# Patient Record
Sex: Female | Born: 1995 | Race: Black or African American | Hispanic: No | Marital: Single | Smoking: Never smoker
Health system: Southern US, Community
[De-identification: ages and names within clinical notes are randomized; demographics above are authoritative.]

## PROBLEM LIST (undated history)

## (undated) ENCOUNTER — Inpatient Hospital Stay (HOSPITAL_COMMUNITY): Payer: Self-pay

## (undated) DIAGNOSIS — N390 Urinary tract infection, site not specified: Secondary | ICD-10-CM

## (undated) DIAGNOSIS — Z789 Other specified health status: Secondary | ICD-10-CM

## (undated) HISTORY — DX: Other specified health status: Z78.9

## (undated) HISTORY — PX: NO PAST SURGERIES: SHX2092

---
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
## 2018-04-02 ENCOUNTER — Encounter (HOSPITAL_COMMUNITY): Payer: Self-pay | Admitting: Emergency Medicine

## 2018-04-02 ENCOUNTER — Emergency Department (HOSPITAL_COMMUNITY)
Admission: EM | Admit: 2018-04-02 | Discharge: 2018-04-02 | Disposition: A | Discharge status: Home / Self Care | Payer: Self-pay | Attending: Emergency Medicine | Admitting: Emergency Medicine

## 2018-04-02 ENCOUNTER — Other Ambulatory Visit: Payer: Self-pay

## 2018-04-02 DIAGNOSIS — N3000 Acute cystitis without hematuria: Secondary | ICD-10-CM | POA: Insufficient documentation

## 2018-04-02 LAB — URINALYSIS, ROUTINE W REFLEX MICROSCOPIC
Bilirubin Urine: NEGATIVE
GLUCOSE, UA: NEGATIVE mg/dL
Hgb urine dipstick: NEGATIVE
Ketones, ur: NEGATIVE mg/dL
Nitrite: NEGATIVE
PROTEIN: NEGATIVE mg/dL
Specific Gravity, Urine: 1.01 (ref 1.005–1.030)
pH: 7 (ref 5.0–8.0)

## 2018-04-02 LAB — PREGNANCY, URINE: PREG TEST UR: NEGATIVE

## 2018-04-02 MED ORDER — PHENAZOPYRIDINE HCL 200 MG PO TABS
200.0000 mg | ORAL_TABLET | Freq: Three times a day (TID) | ORAL | Status: DC
  Administered 2018-04-02: 200 mg via ORAL
  Filled 2018-04-02: qty 1

## 2018-04-02 MED ORDER — NITROFURANTOIN MONOHYD MACRO 100 MG PO CAPS
100.0000 mg | ORAL_CAPSULE | Freq: Once | ORAL | Status: AC
  Administered 2018-04-02: 100 mg via ORAL
  Filled 2018-04-02: qty 1

## 2018-04-02 MED ORDER — NITROFURANTOIN MONOHYD MACRO 100 MG PO CAPS
100.0000 mg | ORAL_CAPSULE | Freq: Two times a day (BID) | ORAL | 0 refills | Status: DC
Start: 2018-04-02 — End: 2018-08-18

## 2018-04-02 MED ORDER — PHENAZOPYRIDINE HCL 200 MG PO TABS: 200.0000 mg | ORAL_TABLET | Freq: Three times a day (TID) | ORAL | 0 refills | Status: DC | PRN

## 2018-04-02 NOTE — ED Triage Notes (Signed)
Patient complaining of uti and runny green stool. Patient states the stool yesterday and uti started Wednesday night. Patient is also complaining of left flank pain.

## 2018-04-02 NOTE — ED Notes (Signed)
Called lab and they added on preg, urine.

## 2018-04-02 NOTE — ED Provider Notes (Signed)
[facility]-EMERGENCY DEPT Provider Note   CSN: [id number] Arrival date & time: 04/02/18  0039     History   Chief Complaint Chief Complaint  Patient presents with  . Flank Pain    HPI Katie Goodman is a 22 y.o. female.  The history is provided by the patient. No language interpreter was used.  Dysuria   This is a new problem. The current episode started 2 days ago. The problem occurs every urination. The problem has not changed since onset.The quality of the pain is described as burning. The pain is at a severity of 5/10. The pain is moderate. There has been no fever. There is no history of pyelonephritis. Associated symptoms include urgency. Pertinent negatives include no chills, no sweats, no nausea, no vomiting, no discharge, no frequency, no hematuria, no possible pregnancy and no flank pain. Associated symptoms comments: Low back pain. She has tried nothing for the symptoms. Her past medical history is significant for recurrent UTIs. Her past medical history does not include kidney stones.  Patient with dysuria for 2 days.  No f/c/r.  Also had diarrhea prior to the start of this.  No flank pain but some low back discomfort.  No vaginal bleeding or discharge.  Denies new partners or any concerns.    Past Medical History:  Diagnosis Date  . UTI (urinary tract infection)     There are no active problems to display for this patient.   History reviewed. No pertinent surgical history.   OB History   None      Home Medications    Prior to Admission medications   Medication Sig Start Date End Date Taking? Authorizing Provider  cetirizine (ZYRTEC) 10 MG tablet Take 10 mg by mouth daily.    [provider]  dexmethylphenidate (FOCALIN XR) 20 MG 24 hr capsule Take 20 mg by mouth daily.    [provider]  nitrofurantoin, macrocrystal-monohydrate, (MACROBID) 100 MG capsule Take 1 capsule (100 mg total) by mouth 2 (two) times daily. X 7  days 04/02/18   Nicanor Alcon, Alinah Sheard, MD  phenazopyridine (PYRIDIUM) 200 MG tablet Take 1 tablet (200 mg total) by mouth 3 (three) times daily as needed for pain. 04/02/18   Kelbie Moro, MD    Family History History reviewed. No pertinent family history.  Social History Social History   Tobacco Use  . Smoking status: Never Smoker  . Smokeless tobacco: Never Used  Substance Use Topics  . Alcohol use: Never    Frequency: Never  . Drug use: Never     Allergies   Patient has no known allergies.   Review of Systems Review of Systems  Constitutional: Negative for chills and fever.  HENT: Negative for congestion.   Eyes: Negative for photophobia.  Respiratory: Negative for shortness of breath.   Cardiovascular: Negative for chest pain.  Gastrointestinal: Negative for abdominal pain, nausea and vomiting.  Genitourinary: Positive for dysuria and urgency. Negative for flank pain, frequency, hematuria, menstrual problem, vaginal bleeding and vaginal discharge.  Neurological: Negative for headaches.  All other systems reviewed and are negative.    Physical Exam Updated Vital Signs BP 116/74 (BP Location: Left Arm)   Pulse 83   Temp 97.8 F (36.6 C) (Oral)   Resp 18   Ht 5\' 8"  (1.727 m)   Wt 79.4 kg (175 lb)   LMP 03/26/2018   SpO2 100%   BMI 26.61 kg/m   Physical Exam  Constitutional: She is oriented to person,  place, and time. She appears well-developed and well-nourished. No distress.  HENT:  Head: Normocephalic and atraumatic.  Mouth/Throat: No oropharyngeal exudate.  Eyes: Pupils are equal, round, and reactive to light. Conjunctivae are normal.  Neck: Normal range of motion. Neck supple.  Cardiovascular: Normal rate, regular rhythm, normal heart sounds and intact distal pulses.  Pulmonary/Chest: Effort normal and breath sounds normal. No stridor. She has no wheezes. She has no rales.  Abdominal: Soft. Bowel sounds are normal. She exhibits no mass. There is no tenderness.  There is no rebound and no guarding.  Musculoskeletal: Normal range of motion.  Neurological: She is alert and oriented to person, place, and time. She displays normal reflexes.  Skin: Skin is warm and dry. Capillary refill takes less than 2 seconds.  Psychiatric: She has a normal mood and affect.  Nursing note and vitals reviewed.    ED Treatments / Results  Labs (all labs ordered are listed, but only abnormal results are displayed) Results for orders placed or performed during the hospital encounter of 04/02/18  Urinalysis, Routine w reflex microscopic- may I&O cath if menses  Result Value Ref Range   Color, Urine YELLOW YELLOW   APPearance CLEAR CLEAR   Specific Gravity, Urine 1.010 1.005 - 1.030   pH 7.0 5.0 - 8.0   Glucose, UA NEGATIVE NEGATIVE mg/dL   Hgb urine dipstick NEGATIVE NEGATIVE   Bilirubin Urine NEGATIVE NEGATIVE   Ketones, ur NEGATIVE NEGATIVE mg/dL   Protein, ur NEGATIVE NEGATIVE mg/dL   Nitrite NEGATIVE NEGATIVE   Leukocytes, UA MODERATE (A) NEGATIVE   RBC / HPF 0-5 0 - 5 RBC/hpf   WBC, UA 11-20 0 - 5 WBC/hpf   Bacteria, UA RARE (A) NONE SEEN   Squamous Epithelial / LPF 0-5 0 - 5   Mucus PRESENT   Pregnancy, urine  Result Value Ref Range   Preg Test, Ur NEGATIVE NEGATIVE   No results found.  EKG None  Radiology No results found.  Procedures Procedures (including critical care time)  Medications Ordered in ED Medications  phenazopyridine (PYRIDIUM) tablet 200 mg (200 mg Oral Given 04/02/18 0457)  nitrofurantoin (macrocrystal-monohydrate) (MACROBID) capsule 100 mg (100 mg Oral Given 04/02/18 0456)      Final Clinical Impressions(s) / ED Diagnoses   Final diagnoses:  Acute cystitis without hematuria    Return for weakness, numbness, changes in vision or speech, fevers >100.4 unrelieved by medication, shortness of breath, intractable vomiting, or diarrhea, abdominal pain, Inability to tolerate liquids or food, cough, altered mental status or  any concerns. No signs of systemic illness or infection. The patient is nontoxic-appearing on exam and vital signs are within normal limits.   I have reviewed the triage vital signs and the nursing notes. Pertinent labs &imaging results that were available during my care of the patient were reviewed by me and considered in my medical decision making (see chart for details).  After history, exam, and medical workup I feel the patient has been appropriately medically screened and is safe for discharge home. Pertinent diagnoses were discussed with the patient. Patient was given return precautions.    ED Discharge Orders        Ordered    nitrofurantoin, macrocrystal-monohydrate, (MACROBID) 100 MG capsule  2 times daily     04/02/18 0530    phenazopyridine (PYRIDIUM) 200 MG tablet  3 times daily PRN     04/02/18 0530       Yu Cragun, MD 04/02/18 [id number]

## 2018-04-02 NOTE — ED Notes (Signed)
Urine culture sent down to lab as well.

## 2018-04-03 ENCOUNTER — Other Ambulatory Visit: Payer: Self-pay

## 2018-04-03 ENCOUNTER — Emergency Department (HOSPITAL_COMMUNITY)
Admission: EM | Admit: 2018-04-03 | Discharge: 2018-04-03 | Disposition: A | Discharge status: Home / Self Care | Payer: Self-pay | Attending: Emergency Medicine | Admitting: Emergency Medicine

## 2018-04-03 ENCOUNTER — Encounter (HOSPITAL_COMMUNITY): Payer: Self-pay | Admitting: *Deleted

## 2018-04-03 DIAGNOSIS — N390 Urinary tract infection, site not specified: Secondary | ICD-10-CM | POA: Insufficient documentation

## 2018-04-03 DIAGNOSIS — T50905A Adverse effect of unspecified drugs, medicaments and biological substances, initial encounter: Secondary | ICD-10-CM | POA: Insufficient documentation

## 2018-04-03 DIAGNOSIS — R197 Diarrhea, unspecified: Secondary | ICD-10-CM

## 2018-04-03 DIAGNOSIS — R112 Nausea with vomiting, unspecified: Secondary | ICD-10-CM

## 2018-04-03 DIAGNOSIS — T887XXA Unspecified adverse effect of drug or medicament, initial encounter: Secondary | ICD-10-CM

## 2018-04-03 DIAGNOSIS — Z79899 Other long term (current) drug therapy: Secondary | ICD-10-CM | POA: Insufficient documentation

## 2018-04-03 DIAGNOSIS — A084 Viral intestinal infection, unspecified: Secondary | ICD-10-CM | POA: Insufficient documentation

## 2018-04-03 LAB — CBC
HCT: 34.2 % — ABNORMAL LOW (ref 36.0–46.0)
Hemoglobin: 11.4 g/dL — ABNORMAL LOW (ref 12.0–15.0)
MCH: 30.4 pg (ref 26.0–34.0)
MCHC: 33.3 g/dL (ref 30.0–36.0)
MCV: 91.2 fL (ref 78.0–100.0)
Platelets: 269 10*3/uL (ref 150–400)
RBC: 3.75 MIL/uL — ABNORMAL LOW (ref 3.87–5.11)
RDW: 13.1 % (ref 11.5–15.5)
WBC: 6.2 10*3/uL (ref 4.0–10.5)

## 2018-04-03 LAB — COMPREHENSIVE METABOLIC PANEL
ALT: 14 U/L (ref 14–54)
AST: 25 U/L (ref 15–41)
Albumin: 4.4 g/dL (ref 3.5–5.0)
Alkaline Phosphatase: 74 U/L (ref 38–126)
Anion gap: 13 (ref 5–15)
BILIRUBIN TOTAL: 0.9 mg/dL (ref 0.3–1.2)
BUN: 11 mg/dL (ref 6–20)
CALCIUM: 9.5 mg/dL (ref 8.9–10.3)
CO2: 21 mmol/L — ABNORMAL LOW (ref 22–32)
CREATININE: 0.62 mg/dL (ref 0.44–1.00)
Chloride: 104 mmol/L (ref 101–111)
GFR calc Af Amer: >60 mL/min (ref >60)
GFR calc non Af Amer: >60 mL/min (ref >60)
Glucose, Bld: 79 mg/dL (ref 65–99)
Potassium: 4.3 mmol/L (ref 3.5–5.1)
Sodium: 138 mmol/L (ref 135–145)
TOTAL PROTEIN: 8.3 g/dL — AB (ref 6.5–8.1)

## 2018-04-03 LAB — DIFFERENTIAL
BASOS ABS: 0 10*3/uL (ref 0.0–0.1)
BASOS PCT: 0 %
Eosinophils Absolute: 0.2 10*3/uL (ref 0.0–0.7)
Eosinophils Relative: 3 %
Lymphocytes Relative: 34 %
Lymphs Abs: 2.1 10*3/uL (ref 0.7–4.0)
Monocytes Absolute: 0.4 10*3/uL (ref 0.1–1.0)
Monocytes Relative: 7 %
NEUTROS PCT: 56 %
Neutro Abs: 3.4 10*3/uL (ref 1.7–7.7)

## 2018-04-03 LAB — LIPASE, BLOOD: LIPASE: 25 U/L (ref 11–51)

## 2018-04-03 MED ORDER — ONDANSETRON HCL 4 MG/2ML IJ SOLN
4.0000 mg | Freq: Once | INTRAMUSCULAR | Status: AC
  Administered 2018-04-03: 4 mg via INTRAVENOUS
  Filled 2018-04-03: qty 2

## 2018-04-03 MED ORDER — SODIUM CHLORIDE 0.9 % IV BOLUS
1000.0000 mL | Freq: Once | INTRAVENOUS | Status: AC
  Administered 2018-04-03: 1000 mL via INTRAVENOUS

## 2018-04-03 MED ORDER — ONDANSETRON 4 MG PO TBDP: 4.0000 mg | ORAL_TABLET | Freq: Three times a day (TID) | ORAL | 0 refills | Status: DC | PRN

## 2018-04-03 NOTE — ED Triage Notes (Signed)
Pt complains of vomiting since this morning. Pt states she was recently put on antibiotics for a UTI, took her first dose yesterday

## 2018-04-03 NOTE — ED Notes (Signed)
ED Provider at bedside. 

## 2018-04-03 NOTE — ED Provider Notes (Signed)
[facility]-EMERGENCY DEPT Provider Note   CSN: [id number] Arrival date & time: 04/03/18  1419     History   Chief Complaint Chief Complaint  Patient presents with  . Emesis    HPI Katie Goodman is a 22 y.o. female with a PMHx of UTIs, who presents to the ED with complaints of n/v that occurred at 4am today.  Per chart review, pt was seen in the ED in the early morning hours of 04/02/18 for c/o UTI symptoms since Wednesday (4 days ago); symptoms included dysuria, increased urinary frequency/urgency, and malodorous urine; she had a U/A that showed +leuks and 11-20 WBCs and only 0-5 squamous and +rare bacteria, Upreg neg, no other labs/imaging were done; she was diagnosed with UTI and sent home with pyridium and bactrim.  She states she started taking the bactrim yesterday, last dose was around 6pm, and she was fine.  She states that she woke up at 4 AM this morning and had 6 episodes of nonbloody nonbilious emesis with associated nausea.  She did not try anything for her symptoms, no known aggravating factors.  She has since been able to keep the rest of her medications down today, last dose of Macrobid was at 10 AM, and has not vomited but continues to feel nauseated.  She mentions that on Thursday, 3 days ago, she was having some diarrhea, states that she was having about 3 episodes daily of nonbloody watery diarrhea however today it has slowed down and she has only had one episode.  She endorses weekly NSAID use.  She denies any recent travel, sick contacts, suspicious food intake, alcohol use, or prior abdominal surgeries.  She has not been on any antibiotics prior to starting the Macrobid yesterday.  She denies fevers, chills, CP, SOB, abd pain, constipation, obstipation, melena, hematochezia, hematemesis, hematuria, vaginal bleeding/discharge, myalgias, arthralgias, numbness, tingling, focal weakness, or any other complaints at this time.  The history is provided by the  patient and medical records. No language interpreter was used.  Emesis   Associated symptoms include diarrhea. Pertinent negatives include no abdominal pain, no arthralgias, no chills, no fever and no myalgias.    Past Medical History:  Diagnosis Date  . UTI (urinary tract infection)     There are no active problems to display for this patient.   History reviewed. No pertinent surgical history.   OB History   None      Home Medications    Prior to Admission medications   Medication Sig Start Date End Date Taking? Authorizing Provider  cetirizine (ZYRTEC) 10 MG tablet Take 10 mg by mouth daily.    [provider]  dexmethylphenidate (FOCALIN XR) 20 MG 24 hr capsule Take 20 mg by mouth daily.    [provider]  nitrofurantoin, macrocrystal-monohydrate, (MACROBID) 100 MG capsule Take 1 capsule (100 mg total) by mouth 2 (two) times daily. X 7 days 04/02/18   Nicanor Alcon, April, MD  phenazopyridine (PYRIDIUM) 200 MG tablet Take 1 tablet (200 mg total) by mouth 3 (three) times daily as needed for pain. 04/02/18   Palumbo, April, MD    Family History No family history on file.  Social History Social History   Tobacco Use  . Smoking status: Never Smoker  . Smokeless tobacco: Never Used  Substance Use Topics  . Alcohol use: Never    Frequency: Never  . Drug use: Never     Allergies   Patient has no known allergies.   Review  of Systems Review of Systems  Constitutional: Negative for chills and fever.  Respiratory: Negative for shortness of breath.   Cardiovascular: Negative for chest pain.  Gastrointestinal: Positive for diarrhea, nausea and vomiting. Negative for abdominal pain, blood in stool and constipation.  Genitourinary: Positive for dysuria, frequency and urgency. Negative for hematuria, vaginal bleeding and vaginal discharge.       +malodorous urine  Musculoskeletal: Negative for arthralgias and myalgias.  Skin: Negative for color change.    Allergic/Immunologic: Negative for immunocompromised state.  Neurological: Negative for weakness and numbness.  Psychiatric/Behavioral: Negative for confusion.   All other systems reviewed and are negative for acute change except as noted in the HPI.    Physical Exam Updated Vital Signs (from Triage at 2:27pm) BP 117/79 (BP Location: Left Arm)   Pulse (!) 102   Temp 98.9 F (37.2 C) (Oral)   Resp 18   LMP 03/26/2018   SpO2 100%  Recheck VS @6pm : BP 104/72   Pulse 82   Temp 98.9 F (37.2 C) (Oral)   Resp 18   LMP 03/26/2018   SpO2 99%    Physical Exam  Constitutional: She is oriented to person, place, and time. Vital signs are normal. She appears well-developed and well-nourished.  Non-toxic appearance. No distress.  Afebrile, nontoxic, NAD  HENT:  Head: Normocephalic and atraumatic.  Mouth/Throat: Oropharynx is clear and moist and mucous membranes are normal.  Eyes: Conjunctivae and EOM are normal. Right eye exhibits no discharge. Left eye exhibits no discharge.  Neck: Normal range of motion. Neck supple.  Cardiovascular: Normal rate, regular rhythm, normal heart sounds and intact distal pulses. Exam reveals no gallop and no friction rub.  No murmur heard. No tachycardia on exam, reg rate and rhythm although there is a very infrequent extra beat (?PVC) every so often, pt completely unaware of this and asymptomatic. Nl s1/s2, no m/r/g, distal pulses intact   Pulmonary/Chest: Effort normal and breath sounds normal. No respiratory distress. She has no decreased breath sounds. She has no wheezes. She has no rhonchi. She has no rales.  Abdominal: Soft. Normal appearance and bowel sounds are normal. She exhibits no distension. There is no tenderness. There is no rigidity, no rebound, no guarding, no CVA tenderness, no tenderness at McBurney's point and negative Murphy's sign.  Soft, NTND, +BS throughout, no r/g/r, neg murphy's, neg mcburney's, no CVA TTP   Musculoskeletal: Normal  range of motion.  Neurological: She is alert and oriented to person, place, and time. She has normal strength. No sensory deficit.  Skin: Skin is warm, dry and intact. No rash noted.  Psychiatric: She has a normal mood and affect.  Nursing note and vitals reviewed.    ED Treatments / Results  Labs (all labs ordered are listed, but only abnormal results are displayed) Labs Reviewed  COMPREHENSIVE METABOLIC PANEL - Abnormal; Notable for the following components:      Result Value   CO2 21 (*)    Total Protein 8.3 (*)    All other components within normal limits  CBC - Abnormal; Notable for the following components:   RBC 3.75 (*)    Hemoglobin 11.4 (*)    HCT 34.2 (*)    All other components within normal limits  LIPASE, BLOOD  DIFFERENTIAL  CBC WITH DIFFERENTIAL/PLATELET   Results for orders placed or performed during the hospital encounter of 04/02/18  Urinalysis, Routine w reflex microscopic- may I&O cath if menses  Result Value Ref Range   Color,  Urine YELLOW YELLOW   APPearance CLEAR CLEAR   Specific Gravity, Urine 1.010 1.005 - 1.030   pH 7.0 5.0 - 8.0   Glucose, UA NEGATIVE NEGATIVE mg/dL   Hgb urine dipstick NEGATIVE NEGATIVE   Bilirubin Urine NEGATIVE NEGATIVE   Ketones, ur NEGATIVE NEGATIVE mg/dL   Protein, ur NEGATIVE NEGATIVE mg/dL   Nitrite NEGATIVE NEGATIVE   Leukocytes, UA MODERATE (A) NEGATIVE   RBC / HPF 0-5 0 - 5 RBC/hpf   WBC, UA 11-20 0 - 5 WBC/hpf   Bacteria, UA RARE (A) NONE SEEN   Squamous Epithelial / LPF 0-5 0 - 5   Mucus PRESENT   Pregnancy, urine  Result Value Ref Range   Preg Test, Ur NEGATIVE NEGATIVE    EKG None  Radiology No results found.  Procedures Procedures (including critical care time)  Medications Ordered in ED Medications  sodium chloride 0.9 % bolus 1,000 mL (1,000 mLs Intravenous New Bag/Given 04/03/18 1720)  ondansetron (ZOFRAN) injection 4 mg (4 mg Intravenous Given 04/03/18 1720)     Initial Impression /  Assessment and Plan / ED Course  I have reviewed the triage vital signs and the nursing notes.  Pertinent labs & imaging results that were available during my care of the patient were reviewed by me and considered in my medical decision making (see chart for details).     22 y.o. female here with n/v onset this morning at 4am. Was here early yesterday morning for UTI symptoms that began 4 days ago, diagnosed with UTI, started on macrobid and pyridium. Had taken it around 6pm, went to sleep and then at 4am woke up vomiting; since then hasn't continued vomiting and has tolerated her meds but still feels nauseated. Had diarrhea starting 3 days ago but it's gradually improving. Upreg yesterday neg. No labs done yesterday aside from U/A and Upreg. On exam today, afebrile and nontoxic, no abdominal or flank tenderness. No tachycardia on exam, despite HR 102 reported in triage; although it seems she has an occasional extra beat (?PVC), but pt isn't symptomatic with this. Will get basic labs to ensure no other abnormalities to explain her n/v, but it's likely that it's either from the antibiotics vs viral gastroenteritis given the diarrhea as well, vs food poisoning, etc. Will give zofran and fluids, then reassess after. Doubt need for imaging at this time.   7:47 PM CBC w/diff with mild anemia (Hgb 11.4/Hct 34.2), unclear what her baseline is, otherwise WNL. CMP essentially WNL. Lipase WNL. Pt feeling better and tolerating PO well here. Symptoms likely from abx use vs viral illness, but doubt other emergent pathology of her symptoms requiring further emergent work up at this time. Will send home with zofran rx, advised continuation of abx for UTI, discussed other OTC remedies for symptomatic relief, and BRAT diet. F/up with PCP in 1wk for recheck of symptoms. I explained the diagnosis and have given explicit precautions to return to the ER including for any other new or worsening symptoms. The patient understands  and accepts the medical plan as it's been dictated and I have answered their questions. Discharge instructions concerning home care and prescriptions have been given. The patient is STABLE and is discharged to home in good condition.    Final Clinical Impressions(s) / ED Diagnoses   Final diagnoses:  Nausea vomiting and diarrhea  Lower urinary tract infectious disease  Medication side effect  Viral gastroenteritis    ED Discharge Orders  Ordered    ondansetron (ZOFRAN ODT) 4 MG disintegrating tablet  Every 8 hours PRN     04/03/18 [address] [place], [place] 04/03/18 1947    Lorre Nick, MD 04/03/18 2254

## 2018-04-03 NOTE — Discharge Instructions (Addendum)
Your symptoms are likely due to the fact that you're on an antibiotic for a urinary tract infection, or it could be from a viral gastrointestinal illness. Continue taking the medications prescribed to you at your last visit, including the antibiotic to treat the urinary tract infection. If you don't want to take the pyridium, you don't have to (that one just helps with the pain with urination, but it's not a necessary medication). Use zofran as prescribed, as needed for nausea; consider premedicating with this medication about 30 minutes before taking your antibiotic. Alternate between tylenol and motrin as needed for pain. May consider using over the counter tums, maalox, pepto bismol, or other over the counter remedies to help with symptoms. Stay well hydrated with small sips of fluids throughout the day. Follow a BRAT (banana-rice-applesauce-toast) diet as described below for the next 24-48 hours. The 'BRAT' diet is suggested, then progress to diet as tolerated as symptoms abate. Call your regular doctor if bloody stools, persistent diarrhea, vomiting, fever or abdominal pain. Follow up with your regular doctor in 1 week for recheck of symptoms. Return to ER for changing or worsening of symptoms.

## 2018-04-03 NOTE — ED Notes (Signed)
Pt drank some fluids for PO challenge, and has kept them down so far. No nausea voiced.

## 2018-08-18 ENCOUNTER — Emergency Department (HOSPITAL_COMMUNITY): Payer: Self-pay

## 2018-08-18 ENCOUNTER — Other Ambulatory Visit: Payer: Self-pay

## 2018-08-18 ENCOUNTER — Encounter (HOSPITAL_COMMUNITY): Payer: Self-pay

## 2018-08-18 ENCOUNTER — Emergency Department (HOSPITAL_COMMUNITY)
Admission: EM | Admit: 2018-08-18 | Discharge: 2018-08-18 | Disposition: A | Discharge status: Home / Self Care | Payer: Self-pay | Attending: Emergency Medicine | Admitting: Emergency Medicine

## 2018-08-18 DIAGNOSIS — R102 Pelvic and perineal pain: Secondary | ICD-10-CM

## 2018-08-18 DIAGNOSIS — B9689 Other specified bacterial agents as the cause of diseases classified elsewhere: Secondary | ICD-10-CM

## 2018-08-18 DIAGNOSIS — N760 Acute vaginitis: Secondary | ICD-10-CM | POA: Insufficient documentation

## 2018-08-18 LAB — WET PREP, GENITAL
Sperm: NONE SEEN
TRICH WET PREP: NONE SEEN
YEAST WET PREP: NONE SEEN

## 2018-08-18 LAB — CBC WITH DIFFERENTIAL/PLATELET
ABS IMMATURE GRANULOCYTES: 0.04 10*3/uL (ref 0.00–0.07)
Basophils Absolute: 0 10*3/uL (ref 0.0–0.1)
Basophils Relative: 1 %
EOS PCT: 4 %
Eosinophils Absolute: 0.2 10*3/uL (ref 0.0–0.5)
HEMATOCRIT: 37.8 % (ref 36.0–46.0)
HEMOGLOBIN: 12 g/dL (ref 12.0–15.0)
Immature Granulocytes: 1 %
LYMPHS ABS: 1.8 10*3/uL (ref 0.7–4.0)
LYMPHS PCT: 34 %
MCH: 29.2 pg (ref 26.0–34.0)
MCHC: 31.7 g/dL (ref 30.0–36.0)
MCV: 92 fL (ref 80.0–100.0)
MONO ABS: 0.4 10*3/uL (ref 0.1–1.0)
MONOS PCT: 9 %
Neutro Abs: 2.7 10*3/uL (ref 1.7–7.7)
Neutrophils Relative %: 51 %
Platelets: 355 10*3/uL (ref 150–400)
RBC: 4.11 MIL/uL (ref 3.87–5.11)
RDW: 13 % (ref 11.5–15.5)
WBC: 5.1 10*3/uL (ref 4.0–10.5)
nRBC: 0 % (ref 0.0–0.2)

## 2018-08-18 LAB — URINALYSIS, ROUTINE W REFLEX MICROSCOPIC
Bilirubin Urine: NEGATIVE
Glucose, UA: NEGATIVE mg/dL
HGB URINE DIPSTICK: NEGATIVE
Ketones, ur: NEGATIVE mg/dL
NITRITE: NEGATIVE
Protein, ur: NEGATIVE mg/dL
SPECIFIC GRAVITY, URINE: 1.024 (ref 1.005–1.030)
pH: 5 (ref 5.0–8.0)

## 2018-08-18 LAB — COMPREHENSIVE METABOLIC PANEL
ALK PHOS: 65 U/L (ref 38–126)
ALT: 23 U/L (ref 0–44)
ANION GAP: 10 (ref 5–15)
AST: 19 U/L (ref 15–41)
Albumin: 4.1 g/dL (ref 3.5–5.0)
BILIRUBIN TOTAL: 0.6 mg/dL (ref 0.3–1.2)
BUN: 13 mg/dL (ref 6–20)
CALCIUM: 9.5 mg/dL (ref 8.9–10.3)
CO2: 25 mmol/L (ref 22–32)
CREATININE: 0.77 mg/dL (ref 0.44–1.00)
Chloride: 111 mmol/L (ref 98–111)
GFR calc non Af Amer: >60 mL/min (ref >60)
Glucose, Bld: 111 mg/dL — ABNORMAL HIGH (ref 70–99)
Potassium: 3.9 mmol/L (ref 3.5–5.1)
Sodium: 146 mmol/L — ABNORMAL HIGH (ref 135–145)
TOTAL PROTEIN: 7.5 g/dL (ref 6.5–8.1)

## 2018-08-18 LAB — I-STAT BETA HCG BLOOD, ED (MC, WL, AP ONLY)

## 2018-08-18 LAB — LIPASE, BLOOD: Lipase: 33 U/L (ref 11–51)

## 2018-08-18 MED ORDER — AZITHROMYCIN 1 G PO PACK
1.0000 g | PACK | Freq: Once | ORAL | Status: AC
  Administered 2018-08-18: 1 g via ORAL
  Filled 2018-08-18: qty 1

## 2018-08-18 MED ORDER — LIDOCAINE HCL 1 % IJ SOLN
INTRAMUSCULAR | Status: AC
  Filled 2018-08-18: qty 20

## 2018-08-18 MED ORDER — METRONIDAZOLE 500 MG PO TABS: 500.0000 mg | ORAL_TABLET | Freq: Two times a day (BID) | ORAL | 0 refills | Status: AC

## 2018-08-18 MED ORDER — CEFTRIAXONE SODIUM 250 MG IJ SOLR
250.0000 mg | Freq: Once | INTRAMUSCULAR | Status: AC
  Administered 2018-08-18: 250 mg via INTRAMUSCULAR
  Filled 2018-08-18: qty 250

## 2018-08-18 NOTE — ED Notes (Signed)
On going ultrasound in room

## 2018-08-18 NOTE — ED Provider Notes (Signed)
[facility]-EMERGENCY DEPT Provider Note   CSN: [id number] Arrival date & time: 08/18/18  0600     History   Chief Complaint Chief Complaint  Patient presents with  . Abdominal Pain    HPI Katie Goodman is a 22 y.o. female.  22yo female presents with complaint of lower abdominal and left side pain x 4 days. Pain is cramping in nature, intermittent, worse at night and with intercourse (last intercourse 2 years ago), improves with IBU and heating pad. Denies nausea, vomiting, changes in bowel or bladder habits, vaginal discharge. Reports intermittent pelvic discomfort since diagnosis and treatment for STD last year. No other complaints or concerns.      Past Medical History:  Diagnosis Date  . UTI (urinary tract infection)     There are no active problems to display for this patient.   History reviewed. No pertinent surgical history.   OB History   None      Home Medications    Prior to Admission medications   Medication Sig Start Date End Date Taking? Authorizing Provider  metroNIDAZOLE (FLAGYL) 500 MG tablet Take 1 tablet (500 mg total) by mouth 2 (two) times daily for 7 days. 08/18/18 08/25/18  Jeannie Fend, PA-C    Family History History reviewed. No pertinent family history.  Social History Social History   Tobacco Use  . Smoking status: Never Smoker  . Smokeless tobacco: Never Used  Substance Use Topics  . Alcohol use: Never    Frequency: Never  . Drug use: Never     Allergies   Patient has no known allergies.   Review of Systems Review of Systems  Constitutional: Negative for appetite change, chills and fever.  Respiratory: Negative for shortness of breath.   Cardiovascular: Negative for chest pain.  Gastrointestinal: Positive for abdominal pain. Negative for blood in stool, constipation, diarrhea, nausea and vomiting.  Genitourinary: Positive for dyspareunia and pelvic pain. Negative for dysuria, frequency,  urgency and vaginal discharge.  Musculoskeletal: Negative for arthralgias, back pain and myalgias.  Skin: Negative for rash and wound.  Allergic/Immunologic: Negative for immunocompromised state.  Neurological: Negative for weakness.  Hematological: Does not bruise/bleed easily.  Psychiatric/Behavioral: Negative for confusion.  All other systems reviewed and are negative.    Physical Exam Updated Vital Signs BP 136/89   Pulse 84   Temp 98.5 F (36.9 C) (Oral)   SpO2 100%   Physical Exam  Constitutional: She is oriented to person, place, and time. She appears well-developed and well-nourished. No distress.  HENT:  Head: Normocephalic and atraumatic.  Cardiovascular: Normal rate, regular rhythm, normal heart sounds and intact distal pulses.  No murmur heard. Pulmonary/Chest: Effort normal and breath sounds normal.  Abdominal: Soft. Normal appearance and bowel sounds are normal. She exhibits no distension. There is no tenderness. There is no CVA tenderness.  Genitourinary: Cervix exhibits no motion tenderness. No tenderness or bleeding in the vagina. Vaginal discharge found.  Genitourinary Comments: Pelvic exam completed after [place] (normal), moderate amount of yellow discharge, no cervical tenderness. RN present for exam.  Neurological: She is alert and oriented to person, place, and time.  Skin: Skin is warm and dry. Rash noted. She is not diaphoretic.  eczema  Psychiatric: She has a normal mood and affect. Her behavior is normal.  Nursing note and vitals reviewed.    ED Treatments / Results  Labs (all labs ordered are listed, but only abnormal results are displayed) Labs Reviewed  WET PREP, GENITAL -  Abnormal; Notable for the following components:      Result Value   Clue Cells Wet Prep HPF POC PRESENT (*)    WBC, Wet Prep HPF POC MANY (*)    All other components within normal limits  COMPREHENSIVE METABOLIC PANEL - Abnormal; Notable for the following components:   Sodium  146 (*)    Glucose, Bld 111 (*)    All other components within normal limits  URINALYSIS, ROUTINE W REFLEX MICROSCOPIC - Abnormal; Notable for the following components:   APPearance HAZY (*)    Leukocytes, UA LARGE (*)    Bacteria, UA RARE (*)    All other components within normal limits  CBC WITH DIFFERENTIAL/PLATELET  LIPASE, BLOOD  I-STAT BETA HCG BLOOD, ED ([facility], [facility], [facility] ONLY)  GC/CHLAMYDIA PROBE AMP () NOT AT [facility]    EKG None  Radiology US Transvaginal Non-ob  Result Date: 08/18/2018 CLINICAL DATA:  Patient with left lower quadrant pain. EXAM: TRANSABDOMINAL AND TRANSVAGINAL ULTRASOUND OF PELVIS DOPPLER ULTRASOUND OF OVARIES TECHNIQUE: Both transabdominal and transvaginal ultrasound examinations of the pelvis were performed. Transabdominal technique was performed for global imaging of the pelvis including uterus, ovaries, adnexal regions, and pelvic cul-de-sac. It was necessary to proceed with endovaginal exam following the transabdominal exam to visualize the adnexal structures. Color and duplex Doppler ultrasound was utilized to evaluate blood flow to the ovaries. COMPARISON:  None. FINDINGS: Uterus Measurements: 7.0 x 3.5 x 3.9 cm. No fibroids or other mass visualized. Endometrium Thickness: 11 mm.  No focal abnormality visualized. Right ovary Measurements: 3.2 x 1.8 x 1.6 cm. Normal appearance/no adnexal mass. Left ovary Measurements: 3.3 x 2.4 x 2.3 cm. Normal appearance/no adnexal mass. Prominent follicle left ovary. Pulsed Doppler evaluation of both ovaries demonstrates normal low-resistance arterial and venous waveforms. Other findings No abnormal free fluid. IMPRESSION: No acute process within the pelvis. No sonographic evidence to suggest torsion. Electronically Signed   By: Annia Belt M.D.   On: 08/18/2018 08:00   US Pelvis Complete  Result Date: 08/18/2018 CLINICAL DATA:  Patient with left lower quadrant pain. EXAM: TRANSABDOMINAL AND TRANSVAGINAL ULTRASOUND OF  PELVIS DOPPLER ULTRASOUND OF OVARIES TECHNIQUE: Both transabdominal and transvaginal ultrasound examinations of the pelvis were performed. Transabdominal technique was performed for global imaging of the pelvis including uterus, ovaries, adnexal regions, and pelvic cul-de-sac. It was necessary to proceed with endovaginal exam following the transabdominal exam to visualize the adnexal structures. Color and duplex Doppler ultrasound was utilized to evaluate blood flow to the ovaries. COMPARISON:  None. FINDINGS: Uterus Measurements: 7.0 x 3.5 x 3.9 cm. No fibroids or other mass visualized. Endometrium Thickness: 11 mm.  No focal abnormality visualized. Right ovary Measurements: 3.2 x 1.8 x 1.6 cm. Normal appearance/no adnexal mass. Left ovary Measurements: 3.3 x 2.4 x 2.3 cm. Normal appearance/no adnexal mass. Prominent follicle left ovary. Pulsed Doppler evaluation of both ovaries demonstrates normal low-resistance arterial and venous waveforms. Other findings No abnormal free fluid. IMPRESSION: No acute process within the pelvis. No sonographic evidence to suggest torsion. Electronically Signed   By: Annia Belt M.D.   On: 08/18/2018 08:00   [place] Art/ven Flow Abd Pelv Doppler  Result Date: 08/18/2018 CLINICAL DATA:  Patient with left lower quadrant pain. EXAM: TRANSABDOMINAL AND TRANSVAGINAL ULTRASOUND OF PELVIS DOPPLER ULTRASOUND OF OVARIES TECHNIQUE: Both transabdominal and transvaginal ultrasound examinations of the pelvis were performed. Transabdominal technique was performed for global imaging of the pelvis including uterus, ovaries, adnexal regions, and pelvic cul-de-sac. It was necessary to  proceed with endovaginal exam following the transabdominal exam to visualize the adnexal structures. Color and duplex Doppler ultrasound was utilized to evaluate blood flow to the ovaries. COMPARISON:  None. FINDINGS: Uterus Measurements: 7.0 x 3.5 x 3.9 cm. No fibroids or other mass visualized. Endometrium Thickness:  11 mm.  No focal abnormality visualized. Right ovary Measurements: 3.2 x 1.8 x 1.6 cm. Normal appearance/no adnexal mass. Left ovary Measurements: 3.3 x 2.4 x 2.3 cm. Normal appearance/no adnexal mass. Prominent follicle left ovary. Pulsed Doppler evaluation of both ovaries demonstrates normal low-resistance arterial and venous waveforms. Other findings No abnormal free fluid. IMPRESSION: No acute process within the pelvis. No sonographic evidence to suggest torsion. Electronically Signed   By: Annia Belt M.D.   On: 08/18/2018 08:00    Procedures Procedures (including critical care time)  Medications Ordered in ED Medications  azithromycin (ZITHROMAX) powder 1 g (has no administration in time range)  cefTRIAXone (ROCEPHIN) injection 250 mg (has no administration in time range)  lidocaine (XYLOCAINE) 1 % (with pres) injection (has no administration in time range)     Initial Impression / Assessment and Plan / ED Course  I have reviewed the triage vital signs and the nursing notes.  Pertinent labs & imaging results that were available during my care of the patient were reviewed by me and considered in my medical decision making (see chart for details).  Clinical Course as of Aug 19 911  Thu Aug 18, 2018  [id number] 22 year old female with complaint of pelvic pain x4 days.  On exam her abdomen is soft and nontender.  hCG is negative, CBC within normal limits, CMP without significant variance.  Urinalysis likely contaminated with large leukocytes and 6-10 epithelials.  Wet prep positive for clue cells.  On exam patient has a moderate amount of yellow discharge.  She has a history of prior STD.  Patient will be treated with Flagyl for her bacterial vaginosis, given Rocephin and Zithromax in the ER to cover STD coverage and advised to abstain from intercourse until she has her test results and partner treated if needed. [place] normal, no evidence of TOA, ectopic pregnancy, torsion.   After the discussed  management above, the patient was determined to be safe for discharge.  The patient was in agreement with this plan and all questions regarding their care were answered.  ED return precautions were discussed and the patient will return to the ED with any significant worsening of condition.      [LM]    Clinical Course User Index [LM] Jeannie Fend, PA-C   Final Clinical Impressions(s) / ED Diagnoses   Final diagnoses:  Pelvic pain in female  Bacterial vaginosis    ED Discharge Orders         Ordered    metroNIDAZOLE (FLAGYL) 500 MG tablet  2 times daily     08/18/18 0900           Jeannie Fend, PA-C 08/18/18 5284    Gerhard Munch, MD 08/18/18 [phone]

## 2018-08-18 NOTE — ED Triage Notes (Signed)
Pt reports abdominal pain over the last 4 days, She states that the location moves, but it is currently in her L lower quadrant. She also reports that she has abdominal pain with intercourse. A&Ox4. Denies vomiting or diarrhea.

## 2018-08-18 NOTE — Discharge Instructions (Addendum)
Abstain from intercourse until you know your test results. Call the hospital in 3-5 days for your results. IF your gonorrhea and chlamydia tests are negative, you may resume intercourse. IF either test is positive, your partner will need to go to the health department for free treatment. IF your test is positive, both partners need to abstain from intercourse for 10 days after treatment (this includes all forms of intercourse).   Take Flagyl as prescribed and complete the full course.  Do not drink alcohol while taking Flagyl.  Follow-up with your primary care provider or health department.  Return to ER for worsening or concerning symptoms.

## 2018-08-19 LAB — GC/CHLAMYDIA PROBE AMP (~~LOC~~) NOT AT ARMC
CHLAMYDIA, DNA PROBE: POSITIVE — AB
NEISSERIA GONORRHEA: NEGATIVE

## 2019-03-06 IMAGING — US US TRANSVAGINAL NON-OB
1 series · 13 of 25 positions shown · non-contrast
Comparison: None.

CLINICAL DATA: Patient with left lower quadrant pain.

EXAM:
TRANSABDOMINAL AND TRANSVAGINAL ULTRASOUND OF PELVIS
DOPPLER ULTRASOUND OF OVARIES
TECHNIQUE: Both transabdominal and transvaginal ultrasound examinations of the
pelvis were performed. Transabdominal technique was performed for
global imaging of the pelvis including uterus, ovaries, adnexal
regions, and pelvic cul-de-sac.
It was necessary to proceed with endovaginal exam following the
transabdominal exam to visualize the adnexal structures. Color and
duplex Doppler ultrasound was utilized to evaluate blood flow to the
ovaries.

[Series 1: us transvaginal non-ob · 0.20mm/px · 70 acquisitions, 13 frames shown]
[im 1/70]
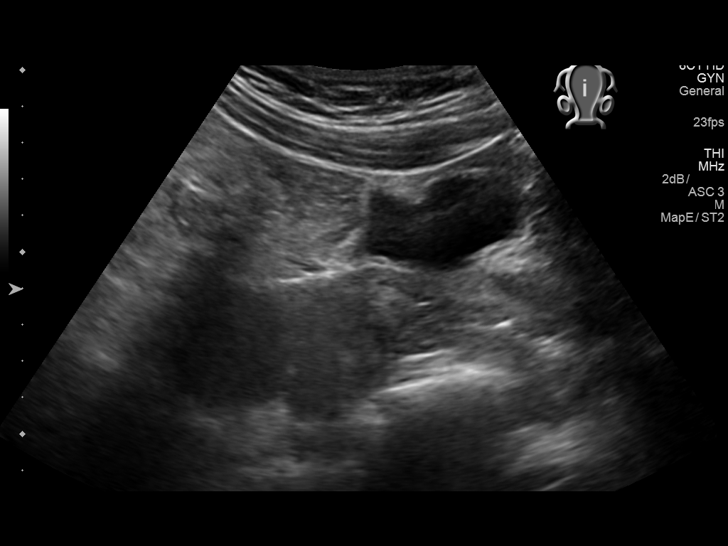
[im 6/70]
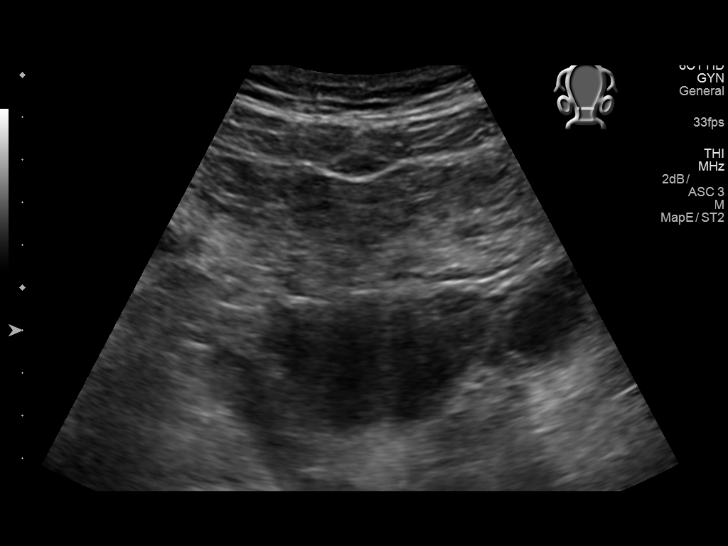
[im 12/70]
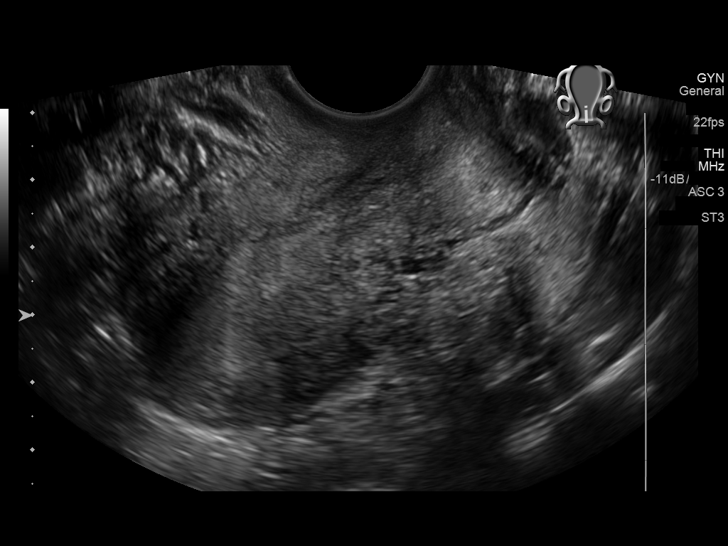
[im 18/70]
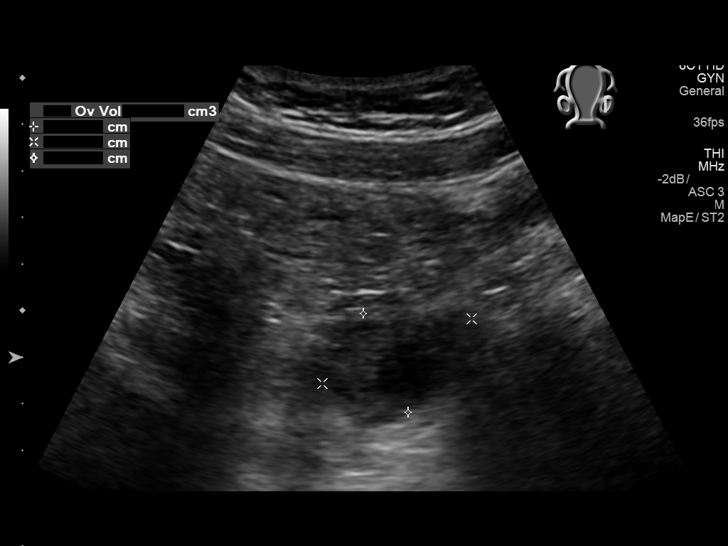
[im 24/70]
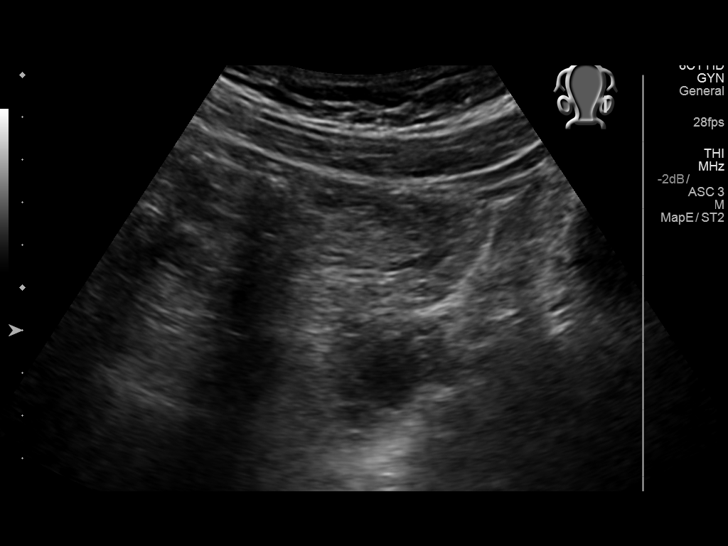
[im 29/70]
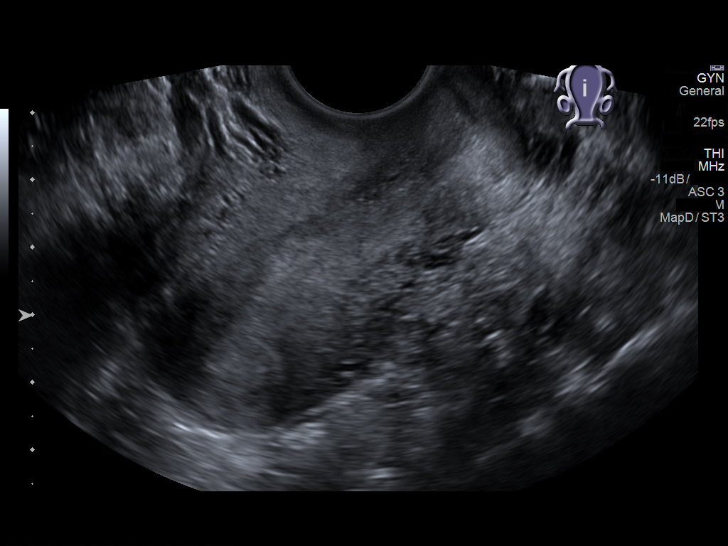
[im 35/70]
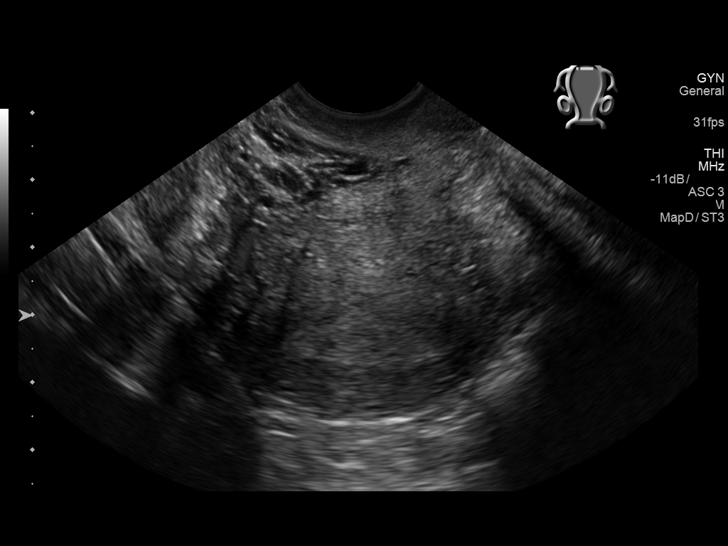
[im 41/70]
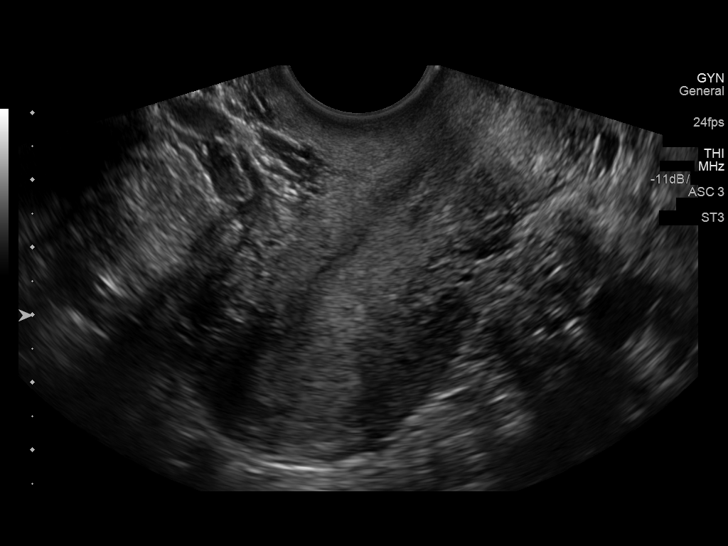
[im 47/70]
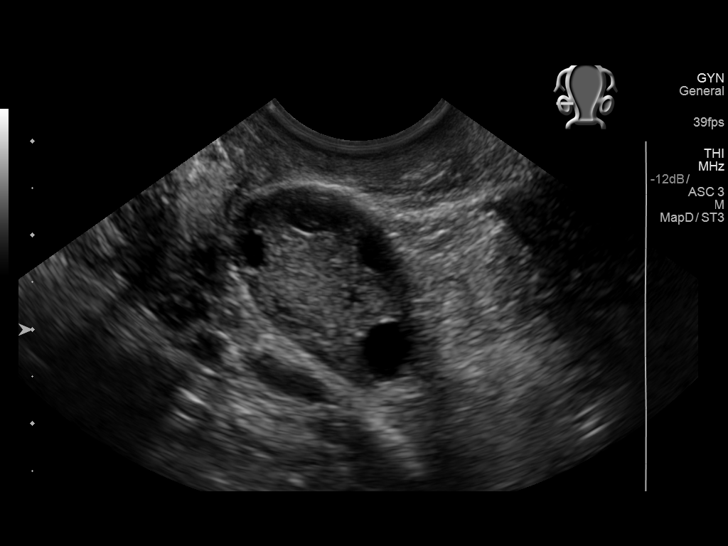
[im 52/70]
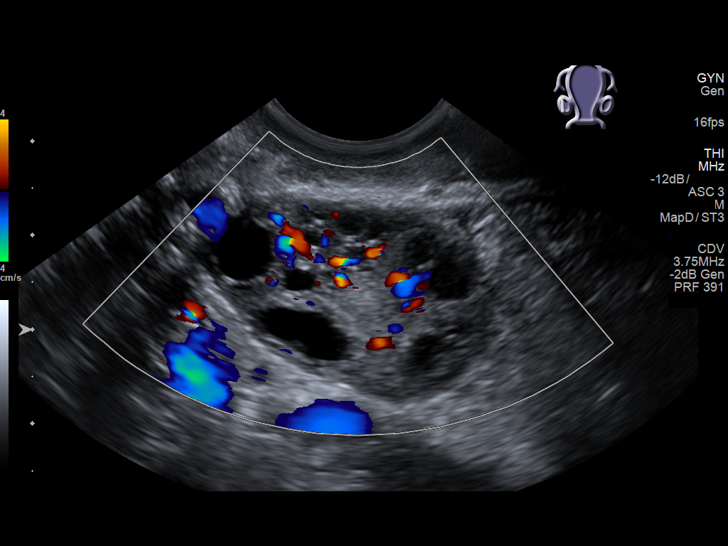
[im 58/70]
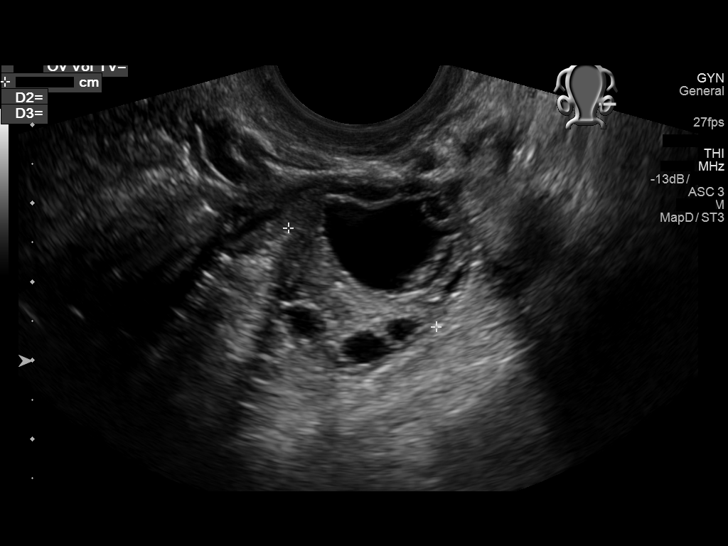
[im 64/70]
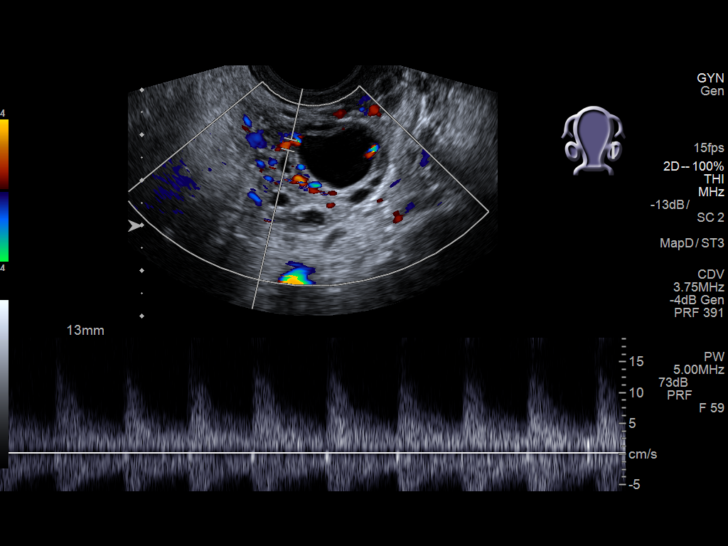
[im 70/70]
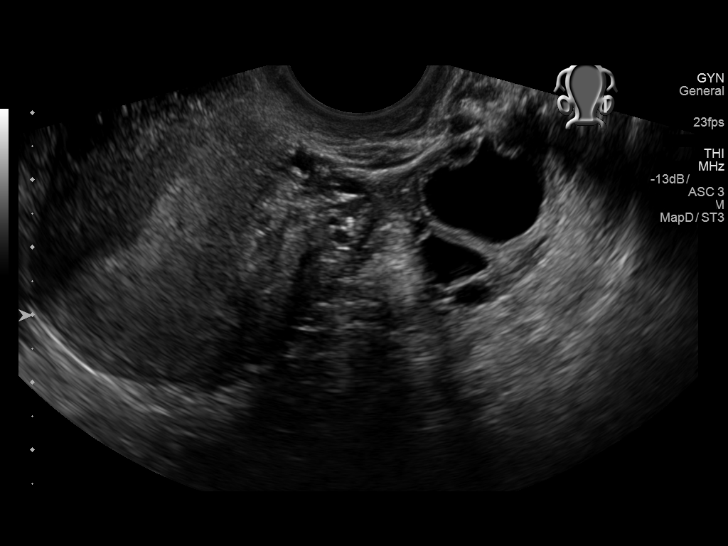

[13 of 25 positions shown; findings below may reference images not displayed]

FINDINGS: Uterus

Measurements: 7.0 x 3.5 x 3.9 cm. No fibroids or other mass
visualized.

Endometrium

Thickness: 11 mm.  No focal abnormality visualized.

Right ovary

Measurements: 3.2 x 1.8 x 1.6 cm. Normal appearance/no adnexal mass.

Left ovary

Measurements: 3.3 x 2.4 x 2.3 cm. Normal appearance/no adnexal mass.
Prominent follicle left ovary.

Pulsed Doppler evaluation of both ovaries demonstrates normal
low-resistance arterial and venous waveforms.

Other findings

No abnormal free fluid.
IMPRESSION: No acute process within the pelvis. No sonographic evidence to
suggest torsion.

## 2021-10-05 ENCOUNTER — Ambulatory Visit (HOSPITAL_COMMUNITY)
Admission: EM | Admit: 2021-10-05 | Discharge: 2021-10-05 | Disposition: A | Discharge status: Home / Self Care | Payer: BLUE CROSS/BLUE SHIELD

## 2021-10-05 ENCOUNTER — Encounter (HOSPITAL_COMMUNITY): Payer: Self-pay | Admitting: *Deleted

## 2021-10-05 ENCOUNTER — Other Ambulatory Visit: Payer: Self-pay

## 2021-10-05 DIAGNOSIS — S61308A Unspecified open wound of other finger with damage to nail, initial encounter: Secondary | ICD-10-CM | POA: Diagnosis not present

## 2021-10-05 DIAGNOSIS — S61309A Unspecified open wound of unspecified finger with damage to nail, initial encounter: Secondary | ICD-10-CM

## 2021-10-05 NOTE — ED Triage Notes (Signed)
Pt reports her natural nail was bent back along with fake nail .

## 2021-10-05 NOTE — Discharge Instructions (Addendum)
Please keep thumbnail clean and dry, can soak in Epson salt for 20 minutes 2-3 times a day, keep tip of thumb bandaged so that natural nail remains intact with nailbed.  Follow-up as needed for inflammation, increased pain, purulent drainage.

## 2021-10-05 NOTE — ED Provider Notes (Signed)
[facility]    CSN: [id number] Arrival date & time: 10/05/21  1252    HISTORY   Chief Complaint  Patient presents with   Nail Problem   HPI Katie Goodman is a 25 y.o. female. Patient states she was play fighting with her boyfriend and accidentally had her fake nail on her left thumb pulled back which brought the natural nail back with it, states the nail is now only attached to the cuticle.  Patient states that it was very painful.  The history is provided by the patient.  Past Medical History:  Diagnosis Date   UTI (urinary tract infection)    There are no problems to display for this patient.  History reviewed. No pertinent surgical history. OB History   No obstetric history on file.    Home Medications    Prior to Admission medications   Not on File   Family History History reviewed. No pertinent family history. Social History Social History   Tobacco Use   Smoking status: Never   Smokeless tobacco: Never  Vaping Use   Vaping Use: Never used  Substance Use Topics   Alcohol use: Never   Drug use: Never   Allergies   Patient has no known allergies.  Review of Systems Review of Systems Pertinent findings noted in history of present illness.   Physical Exam Triage Vital Signs ED Triage Vitals  Enc Vitals Group     BP 08/29/21 0827 (!) 147/82     Pulse Rate 08/29/21 0827 72     Resp 08/29/21 0827 18     Temp 08/29/21 0827 98.3 F (36.8 C)     Temp Source 08/29/21 0827 Oral     SpO2 08/29/21 0827 98 %     Weight --      Height --      Head Circumference --      Peak Flow --      Pain Score 08/29/21 0826 5     Pain Loc --      Pain Edu? --      Excl. in GC? --   No data found.  Updated Vital Signs BP 125/79   Pulse 81   Temp 98.3 F (36.8 C)   Resp 18   LMP 10/02/2021   SpO2 100%   Physical Exam Vitals and nursing note reviewed.  Constitutional:      General: She is not in acute distress.    Appearance: Normal  appearance. She is not ill-appearing.  HENT:     Head: Normocephalic and atraumatic.  Eyes:     General: Lids are normal.        Right eye: No discharge.        Left eye: No discharge.     Extraocular Movements: Extraocular movements intact.     Conjunctiva/sclera: Conjunctivae normal.     Right eye: Right conjunctiva is not injected.     Left eye: Left conjunctiva is not injected.  Neck:     Trachea: Trachea and phonation normal.  Cardiovascular:     Rate and Rhythm: Normal rate and regular rhythm.     Pulses: Normal pulses.     Heart sounds: Normal heart sounds. No murmur heard.   No friction rub. No gallop.  Pulmonary:     Effort: Pulmonary effort is normal. No accessory muscle usage, prolonged expiration or respiratory distress.     Breath sounds: Normal breath sounds. No stridor, decreased air movement or transmitted upper airway  sounds. No decreased breath sounds, wheezing, rhonchi or rales.  Chest:     Chest wall: No tenderness.  Musculoskeletal:        General: Normal range of motion.     Cervical back: Normal range of motion and neck supple. Normal range of motion.  Lymphadenopathy:     Cervical: No cervical adenopathy.  Skin:    General: Skin is warm and dry.     Findings: No erythema or rash.     Comments: Left lateral thumb nail is avulsed, thick decorative acrylic nail is still attached, natural nail is still attached at base of cuticle, bleeding has stopped.  Neurological:     General: No focal deficit present.     Mental Status: She is alert and oriented to person, place, and time.  Psychiatric:        Mood and Affect: Mood normal.        Behavior: Behavior normal.    Visual Acuity Right Eye Distance:   Left Eye Distance:   Bilateral Distance:    Right Eye Near:   Left Eye Near:    Bilateral Near:     UC Couse / Diagnostics / Procedures:    EKG  Radiology No results found.  Procedures Procedures (including critical care time)  UC Diagnoses /  Final Clinical Impressions(s)   I have reviewed the triage vital signs and the nursing notes.  Pertinent labs & imaging results that were available during my care of the patient were reviewed by me and considered in my medical decision making (see chart for details).    Final diagnoses:  Nail avulsion, finger, initial encounter   Patient advised no intervention needed at this time.  Patient advised to keep thumbnail intact with some, soak with Epson salt, follow-up as needed for any acute signs of infection.  ED Prescriptions   None    PDMP not reviewed this encounter.  Pending results:  Labs Reviewed - No data to display  Medications Ordered in UC: Medications - No data to display  Disposition Upon Discharge:  Condition: stable for discharge home Home: take medications as prescribed; routine discharge instructions as discussed; follow up as advised.  Patient presented with an acute illness with associated systemic symptoms and significant discomfort requiring urgent management. In my opinion, this is a condition that a prudent lay person (someone who possesses an average knowledge of health and medicine) may potentially expect to result in complications if not addressed urgently such as respiratory distress, impairment of bodily function or dysfunction of bodily organs.   Routine symptom specific, illness specific and/or disease specific instructions were discussed with the patient and/or caregiver at length.   As such, the patient has been evaluated and assessed, work-up was performed and treatment was provided in alignment with urgent care protocols and evidence based medicine.  Patient/parent/caregiver has been advised that the patient may require follow up for further testing and treatment if the symptoms continue in spite of treatment, as clinically indicated and appropriate.  If the patient was tested for COVID-19, Influenza and/or RSV, then the patient/parent/guardian was  advised to isolate at home pending the results of his/her diagnostic coronavirus test and potentially longer if they're positive. I have also advised pt that if his/her COVID-19 test returns positive, it's recommended to self-isolate for at least 10 days after symptoms first appeared AND until fever-free for 24 hours without fever reducer AND other symptoms have improved or resolved. Discussed self-isolation recommendations as well as instructions for household  member/close contacts as per the CDC and [place] DHHS, and also gave patient the COVID packet with this information.  Patient/parent/caregiver has been advised to return to the [facility] or PCP in 3-5 days if no better; to PCP or the Emergency Department if new signs and symptoms develop, or if the current signs or symptoms continue to change or worsen for further workup, evaluation and treatment as clinically indicated and appropriate  The patient will follow up with their current PCP if and as advised. If the patient does not currently have a PCP we will assist them in obtaining one.   The patient may need specialty follow up if the symptoms continue, in spite of conservative treatment and management, for further workup, evaluation, consultation and treatment as clinically indicated and appropriate.   Patient/parent/caregiver verbalized understanding and agreement of plan as discussed.  All questions were addressed during visit.  Please see discharge instructions below for further details of plan.  Discharge Instructions:   Discharge Instructions      Please keep thumbnail clean and dry, can soak in Epson salt for 20 minutes 2-3 times a day, keep tip of thumb bandaged so that natural nail remains intact with nailbed.  Follow-up as needed for inflammation, increased pain, purulent drainage.         Theadora Rama Scales, [place] 10/05/21 [phone]

## 2021-10-10 ENCOUNTER — Emergency Department (INDEPENDENT_AMBULATORY_CARE_PROVIDER_SITE_OTHER)
Admission: RE | Admit: 2021-10-10 | Discharge: 2021-10-10 | Disposition: A | Discharge status: Home / Self Care | Payer: BC Managed Care – PPO | Source: Ambulatory Visit

## 2021-10-10 ENCOUNTER — Emergency Department (INDEPENDENT_AMBULATORY_CARE_PROVIDER_SITE_OTHER): Payer: BC Managed Care – PPO

## 2021-10-10 ENCOUNTER — Other Ambulatory Visit: Payer: Self-pay

## 2021-10-10 VITALS — BP 113/77 | HR 114 | Temp 98.4°F | Resp 17

## 2021-10-10 DIAGNOSIS — S61102A Unspecified open wound of left thumb with damage to nail, initial encounter: Secondary | ICD-10-CM

## 2021-10-10 DIAGNOSIS — M79645 Pain in left finger(s): Secondary | ICD-10-CM | POA: Diagnosis not present

## 2021-10-10 DIAGNOSIS — M20012 Mallet finger of left finger(s): Secondary | ICD-10-CM | POA: Diagnosis not present

## 2021-10-10 MED ORDER — CEPHALEXIN 500 MG PO CAPS: 500.0000 mg | ORAL_CAPSULE | Freq: Four times a day (QID) | ORAL | 0 refills | Status: AC

## 2021-10-10 MED ORDER — NAPROXEN 500 MG PO TABS: 500.0000 mg | ORAL_TABLET | Freq: Two times a day (BID) | ORAL | 0 refills | Status: AC

## 2021-10-10 NOTE — ED Triage Notes (Signed)
Pt c/o LT thumb pain since Sunday. Says she was play fighting with her brother when her acrylic and real nail ripped off. States still pretty swollen and painful. Some pus drainage on Wed eve. Pain 8/10 Ibuprofen and antibiotic ointment prn.

## 2021-10-10 NOTE — Discharge Instructions (Addendum)
Take NSAID as prescribed with food Wear brace as discussed Follow up with ortho in 1-2 weeks Only start antibiotic if worsening swelling or erythema develops.

## 2021-10-10 NOTE — ED Provider Notes (Signed)
[facility]    CSN: [id number] Arrival date & time: 10/10/21  1845      History   Chief Complaint Chief Complaint  Patient presents with   Thumb pain    LT    HPI Katie Goodman is a 25 y.o. female.   Pleasant 25 year old female presents today for a follow-up on her left thumb.  She was seen initially on 10/05/21, the day of the injury happened.  She states she was roughhousing with her brother who accidentally hyperextended her thumb.  At that time she had fake acrylic fingernails on, which pulled back her real nail causing a complete avulsion.  No treatment was provided other than recommendation for Epsom salt soaks on the fourth.  She presents today with concerns about possible infection.  Her thumb is more swollen than previously and it is hard to bend.  It is painful to touch on the joint.  She has not tried any over-the-counter medications.    Past Medical History:  Diagnosis Date   UTI (urinary tract infection)     There are no problems to display for this patient.   History reviewed. No pertinent surgical history.  OB History   No obstetric history on file.      Home Medications    Prior to Admission medications   Medication Sig Start Date End Date Taking? Authorizing Provider  cephALEXin (KEFLEX) 500 MG capsule Take 1 capsule (500 mg total) by mouth 4 (four) times daily for 5 days. 10/10/21 10/15/21 Yes Welcome Fults L, PA  naproxen (NAPROSYN) 500 MG tablet Take 1 tablet (500 mg total) by mouth 2 (two) times daily for 7 days. 10/10/21 10/17/21 Yes Raney Antwine, Jodelle Gross, PA    Family History History reviewed. No pertinent family history.  Social History Social History   Tobacco Use   Smoking status: Never   Smokeless tobacco: Never  Vaping Use   Vaping Use: Never used  Substance Use Topics   Alcohol use: Never   Drug use: Never     Allergies   Patient has no known allergies.   Review of Systems Review of Systems  Musculoskeletal:   Positive for arthralgias and joint swelling.  All other systems reviewed and are negative.   Physical Exam Triage Vital Signs ED Triage Vitals  Enc Vitals Group     BP 10/10/21 1852 113/77     Pulse Rate 10/10/21 1852 (!) 114     Resp 10/10/21 1852 17     Temp 10/10/21 1852 98.4 F (36.9 C)     Temp Source 10/10/21 1852 Oral     SpO2 10/10/21 1852 99 %     Weight --      Height --      Head Circumference --      Peak Flow --      Pain Score 10/10/21 1854 8     Pain Loc --      Pain Edu? --      Excl. in GC? --    No data found.  Updated Vital Signs BP 113/77 (BP Location: Right Arm)   Pulse (!) 114   Temp 98.4 F (36.9 C) (Oral)   Resp 17   LMP 10/02/2021   SpO2 99%   Visual Acuity Right Eye Distance:   Left Eye Distance:   Bilateral Distance:    Right Eye Near:   Left Eye Near:    Bilateral Near:     Physical Exam Vitals and nursing note  reviewed.  Constitutional:      Appearance: Normal appearance. She is normal weight.  HENT:     Head: Normocephalic and atraumatic.  Musculoskeletal:        General: Swelling, tenderness, deformity and signs of injury present.     Right lower leg: No edema.     Left lower leg: No edema.     Comments: Swelling erythema and tenderness to the IP joint of the left thumb.  Thumb is stuck in flexion at the IP joint.  Skin:    General: Skin is warm.  Neurological:     General: No focal deficit present.     Mental Status: She is alert. Mental status is at baseline.     UC Treatments / Results  Labs (all labs ordered are listed, but only abnormal results are displayed) Labs Reviewed - No data to display  EKG   Radiology DG Finger Thumb Left  Result Date: 10/10/2021 CLINICAL DATA:  Hyperextension of thumb.  Pain. EXAM: LEFT THUMB 2+V COMPARISON:  None. FINDINGS: There is no evidence of fracture or dislocation. There is no evidence of arthropathy or other focal bone abnormality. Soft tissues are unremarkable.  IMPRESSION: Negative. Electronically Signed   By: Darliss Cheney M.D.   On: 10/10/2021 19:28    Procedures Splint Application  Date/Time: 10/10/2021 8:12 PM Performed by: Maretta Bees, PA Authorized by: Maretta Bees, PA   Consent:    Consent obtained:  Verbal Procedure details:    Location:  Finger   Finger location:  L thumb   Cast type:  Finger   Splint type:  Finger   Attestation: Splint applied and adjusted personally by me   Post-procedure details:    Distal neurologic exam:  Normal   Procedure completion:  Tolerated   Post-procedure imaging: reviewed   (including critical care time)  Medications Ordered in UC Medications - No data to display  Initial Impression / Assessment and Plan / UC Course  I have reviewed the triage vital signs and the nursing notes.  Pertinent labs & imaging results that were available during my care of the patient were reviewed by me and considered in my medical decision making (see chart for details).     Mallet thumb -referral placed to Ortho.  NSAIDs as discussed.  Splint placed on finger, attempted hyperextension however too painful for patient. Nail avulsion -question developing infection.  Recommended patient monitor symptoms for improvement for the next 1 to 2 days prior to starting antibiotics.  Take only if worsening erythema or swelling  Final Clinical Impressions(s) / UC Diagnoses   Final diagnoses:  Mallet thumb, left  Avulsion of nail of left thumb     Discharge Instructions      Take NSAID as prescribed with food Wear brace as discussed Follow up with ortho in 1-2 weeks Only start antibiotic if worsening swelling or erythema develops.     ED Prescriptions     Medication Sig Dispense Auth. Provider   naproxen (NAPROSYN) 500 MG tablet Take 1 tablet (500 mg total) by mouth 2 (two) times daily for 7 days. 14 tablet Erabella Kuipers L, PA   cephALEXin (KEFLEX) 500 MG capsule Take 1 capsule (500 mg total) by mouth 4  (four) times daily for 5 days. 20 capsule Laaibah Wartman L, PA      PDMP not reviewed this encounter.   Maretta Bees, [place] 10/10/21 2014

## 2022-07-29 ENCOUNTER — Emergency Department (HOSPITAL_COMMUNITY): Payer: BC Managed Care – PPO

## 2022-07-29 ENCOUNTER — Emergency Department (HOSPITAL_COMMUNITY)
Admission: EM | Admit: 2022-07-29 | Discharge: 2022-07-29 | Disposition: A | Discharge status: Home / Self Care | Payer: BC Managed Care – PPO | Attending: Emergency Medicine | Admitting: Emergency Medicine

## 2022-07-29 ENCOUNTER — Other Ambulatory Visit: Payer: Self-pay

## 2022-07-29 ENCOUNTER — Encounter (HOSPITAL_COMMUNITY): Payer: Self-pay | Admitting: Emergency Medicine

## 2022-07-29 DIAGNOSIS — N3001 Acute cystitis with hematuria: Secondary | ICD-10-CM | POA: Insufficient documentation

## 2022-07-29 DIAGNOSIS — K5900 Constipation, unspecified: Secondary | ICD-10-CM | POA: Diagnosis not present

## 2022-07-29 DIAGNOSIS — R300 Dysuria: Secondary | ICD-10-CM | POA: Diagnosis present

## 2022-07-29 LAB — URINALYSIS, ROUTINE W REFLEX MICROSCOPIC
Bilirubin Urine: NEGATIVE
Glucose, UA: NEGATIVE mg/dL
Ketones, ur: NEGATIVE mg/dL
Nitrite: NEGATIVE
Protein, ur: 100 mg/dL — AB
Specific Gravity, Urine: 1.026 (ref 1.005–1.030)
WBC, UA: >50 WBC/hpf — ABNORMAL HIGH (ref 0–5)
pH: 6 (ref 5.0–8.0)

## 2022-07-29 LAB — PREGNANCY, URINE: Preg Test, Ur: NEGATIVE

## 2022-07-29 MED ORDER — CEPHALEXIN 500 MG PO CAPS: 500.0000 mg | ORAL_CAPSULE | Freq: Two times a day (BID) | ORAL | 0 refills | Status: AC

## 2022-07-29 NOTE — ED Notes (Signed)
Patient back from  X-ray 

## 2022-07-29 NOTE — Discharge Instructions (Signed)
UTI-your urine is consistent with a UTI, have started you on a antibiotic please take as prescribed.  I would like you to follow-up with community health and wellness, [place] or [place]. Constipation-I like to start taking MiraLAX 1 capful a day, please drink plenty of water as this medication only works if you are hydrated, this can take up to 2 to 3 days to see results, may titrate until desired consistency as well as frequency.  Remember to exercise as well as eat foods high in fiber this will help prevent future constipation.  Come back to the emergency department if you develop chest pain, shortness of breath, severe abdominal pain, uncontrolled nausea, vomiting, diarrhea.

## 2022-07-29 NOTE — ED Provider Notes (Signed)
[facility] EMERGENCY DEPARTMENT Provider Note   CSN: [id number] Arrival date & time: 07/29/22  0645     History  Chief Complaint  Patient presents with   Abdominal Pain    Katie Goodman is a 26 y.o. female.  HPI   Patient without significant medical history presents with complaints of dysuria as well as lower abdominal cramping.  Patient states has been going on for about 2 weeks time, states both symptoms started at the same time, states that she has been having dysuria typically burning-like sensation after she urinates, has been more consistent, no vaginal discharge, states she is currently on her menstrual cycle, she is not on birth control, no history of ovarian cysts or  torsion's.  She also notes that she is having some lower abdominal pain, states this pain is intermittent, and is mainly when she started to have a bowel movements, states that she has been constipated for about 2 weeks, is only  passing a little bit of stool a little bit of gas, no associated nausea or vomiting, no abdominal surgeries, denies any melena or hematochezia, states that she tried 1 dose of MiraLAX without much relief.    Home Medications Prior to Admission medications   Medication Sig Start Date End Date Taking? Authorizing Provider  cephALEXin (KEFLEX) 500 MG capsule Take 1 capsule (500 mg total) by mouth 2 (two) times daily for 7 days. 07/29/22 08/05/22 Yes Carroll Sage, PA-C      Allergies    Patient has no known allergies.    Review of Systems   Review of Systems  Constitutional:  Negative for chills and fever.  Respiratory:  Negative for shortness of breath.   Cardiovascular:  Negative for chest pain.  Gastrointestinal:  Positive for abdominal pain and constipation. Negative for nausea and vomiting.  Genitourinary:  Positive for dysuria and vaginal bleeding. Negative for menstrual problem.  Neurological:  Negative for headaches.    Physical Exam Updated Vital Signs BP 116/70  (BP Location: Right Arm)   Pulse 63   Temp 98.4 F (36.9 C) (Oral)   Resp 16   LMP 07/25/2022   SpO2 100%  Physical Exam Vitals and nursing note reviewed.  Constitutional:      General: She is not in acute distress.    Appearance: She is not ill-appearing.  HENT:     Head: Normocephalic and atraumatic.     Nose: No congestion.  Eyes:     Conjunctiva/sclera: Conjunctivae normal.  Cardiovascular:     Rate and Rhythm: Normal rate and regular rhythm.  Pulmonary:     Effort: Pulmonary effort is normal.  Abdominal:     Palpations: Abdomen is soft.     Tenderness: There is no abdominal tenderness. There is no right CVA tenderness or left CVA tenderness.     Comments: Abdomen is soft, nondistended, nontender on my exam, there is no guarding rebound tenderness or peritoneal sign.  Skin:    General: Skin is warm and dry.  Neurological:     Mental Status: She is alert.  Psychiatric:        Mood and Affect: Mood normal.     ED Results / Procedures / Treatments   Labs (all labs ordered are listed, but only abnormal results are displayed) Labs Reviewed  URINALYSIS, ROUTINE W REFLEX MICROSCOPIC - Abnormal; Notable for the following components:      Result Value   APPearance CLOUDY (*)    Hgb urine dipstick SMALL (*)  Protein, ur 100 (*)    Leukocytes,Ua LARGE (*)    WBC, UA >50 (*)    Bacteria, UA RARE (*)    All other components within normal limits  URINE CULTURE  PREGNANCY, URINE    EKG None  Radiology DG Abdomen Acute W/Chest  Result Date: 07/29/2022 CLINICAL DATA:  Constipation and abdominal pain for 2 weeks. EXAM: DG ABDOMEN ACUTE WITH 1 VIEW CHEST COMPARISON:  None Available. FINDINGS: Heart size and mediastinal contours are unremarkable. There is no pleural effusion or edema. Lungs are clear. Bowel gas pattern appears nonobstructed. There is a moderate stool burden identified within the right colon and transverse colon. No dilated small bowel loops. IMPRESSION: 1.  Nonobstructive bowel gas pattern. 2. Moderate stool burden within the right colon and transverse colon. Electronically Signed   By: Signa Kell M.D.   On: 07/29/2022 10:13    Procedures Procedures    Medications Ordered in ED Medications - No data to display  ED Course/ Medical Decision Making/ A&P                           Medical Decision Making Amount and/or Complexity of Data Reviewed Labs: ordered. Radiology: ordered.   This patient presents to the ED for concern of constipation, dysuria, this involves an extensive number of treatment options, and is a complaint that carries with it a high risk of complications and morbidity.  The differential diagnosis includes bowel obstruction, UTI, ovarian torsion, PID,    Additional history obtained:  Additional history obtained from N/A External records from outside source obtained and reviewed including previous ER notes   Co morbidities that complicate the patient evaluation  N/A  Social Determinants of Health:  No primary care provider    Lab Tests:  I Ordered, and personally interpreted labs.  The pertinent results include: Urine pregnancy is negative, UA is positive for leukocytes red blood cells white blood cells rare bacteria, urine culture pending   Imaging Studies ordered:  I ordered imaging studies including acute chest abdomen I independently visualized and interpreted imaging which showed she shows mild constipation I agree with the radiologist interpretation   Cardiac Monitoring:  The patient was maintained on a cardiac monitor.  I personally viewed and interpreted the cardiac monitored which showed an underlying rhythm of: N/A   Medicines ordered and prescription drug management:  I ordered medication including N/A I have reviewed the patients home medicines and have made adjustments as needed  Critical Interventions:  N/A   Reevaluation:  Presents with dysuria as well as lower abdominal  pain, UA is consistent with UTI, her abdomen was soft, no evidence of acute abdomen, will obtain plain films for further evaluation.    Consultations Obtained:  N/A   Test Considered:  CT abdomen pelvis-deferred as my suspicion for intra-abdominal abnormality is low at this time she had an nonsurgical abdomen presentation is atypical she is nontoxic-appearing.    Rule out I have low suspicion for ovarian torsion or ovarian cyst this presentation is atypical of etiology, symptoms are mainly with bowel movements, she was nontender during my exam she has no history of this.  I have low suspicion for ectopic pregnancy as urine pregnancy is negative.  I doubt PID as she denies vaginal pain no vaginal discharge.  I have low suspicion for bowel obstruction and or volvulus that she has no nausea or vomiting, abdomen is nondistended still passing gas and having bowel movements,  x-rays negative for evidence of obstruction.  Low suspicion for kidney stone or [place] as she has no flank tenderness, she is nontoxic-appearing, there is noted blood on her UA but she is currently on her menstrual cycle which I suspect is likely cause of this.    Dispostion and problem list  After consideration of the diagnostic results and the patients response to treatment, I feel that the patent would benefit from discharge.   UTI-urine cultures are pending, will send home on antibiotics follow-up with PCP as needed strict return precautions Abdominal pain-likely secondary due to functional constipation, will have her continue with MiraLAX and improve bowel hygiene, follow-up with PCP as needed strict return precautions            Final Clinical Impression(s) / ED Diagnoses Final diagnoses:  Constipation, unspecified constipation type  Acute cystitis with hematuria    Rx / DC Orders ED Discharge Orders          Ordered    cephALEXin (KEFLEX) 500 MG capsule  2 times daily        07/29/22 1033               Carroll Sage, PA-C 07/29/22 1036    Loetta Rough, MD 07/30/22 2311

## 2022-07-29 NOTE — ED Triage Notes (Signed)
Pt c/o lower abd pain x 2 weeks. Lnbm sep 10. Had small runny bm yesterday. Denies n/v. Has used mirilax.

## 2022-07-29 NOTE — ED Notes (Signed)
Patient transported to X-ray 

## 2022-07-31 LAB — URINE CULTURE: Culture: 20000 — AB

## 2022-08-01 ENCOUNTER — Telehealth (HOSPITAL_BASED_OUTPATIENT_CLINIC_OR_DEPARTMENT_OTHER): Payer: Self-pay | Admitting: *Deleted

## 2022-08-01 NOTE — Telephone Encounter (Signed)
Post ED Visit - Positive Culture Follow-up  Culture report reviewed by antimicrobial stewardship pharmacist: [place] Team []  Elenor Quinones, Pharm.D. []  Heide Guile, Pharm.D., BCPS AQ-ID []  Parks Neptune, Pharm.D., BCPS []  Alycia Rossetti, Pharm.D., BCPS []  [place], Pharm.D., BCPS, AAHIVP []  Legrand Como, Pharm.D., BCPS, AAHIVP []  Salome Arnt, PharmD, BCPS []  Johnnette Gourd, PharmD, BCPS []  Hughes Better, PharmD, BCPS []  Leeroy Cha, PharmD []  Laqueta Linden, PharmD, BCPS [x]  Jim Desanctis, PharmD  [place] Team []  Leodis Sias, PharmD []  Lindell Spar, PharmD []  Royetta Asal, PharmD []  Graylin Shiver, Rph []  Rema Fendt) Glennon Mac, PharmD []  Arlyn Dunning, PharmD []  Netta Cedars, PharmD []  Dia Sitter, PharmD []  Leone Haven, PharmD []  Gretta Arab, PharmD []  Theodis Shove, PharmD []  Peggyann Juba, PharmD []  Reuel Boom, PharmD   Positive urine culture Treated with Cephalexin, organism sensitive to the same and no further patient follow-up is required at this time.  Katie Goodman 08/01/2022, 2:40 PM

## 2023-02-14 IMAGING — DX DG FINGER THUMB 2+V*L*
3 series · 3 of 3 positions shown · non-contrast
Comparison: None.

CLINICAL DATA: Hyperextension of thumb.  Pain.

EXAM:
LEFT THUMB 2+V

[finger ap]
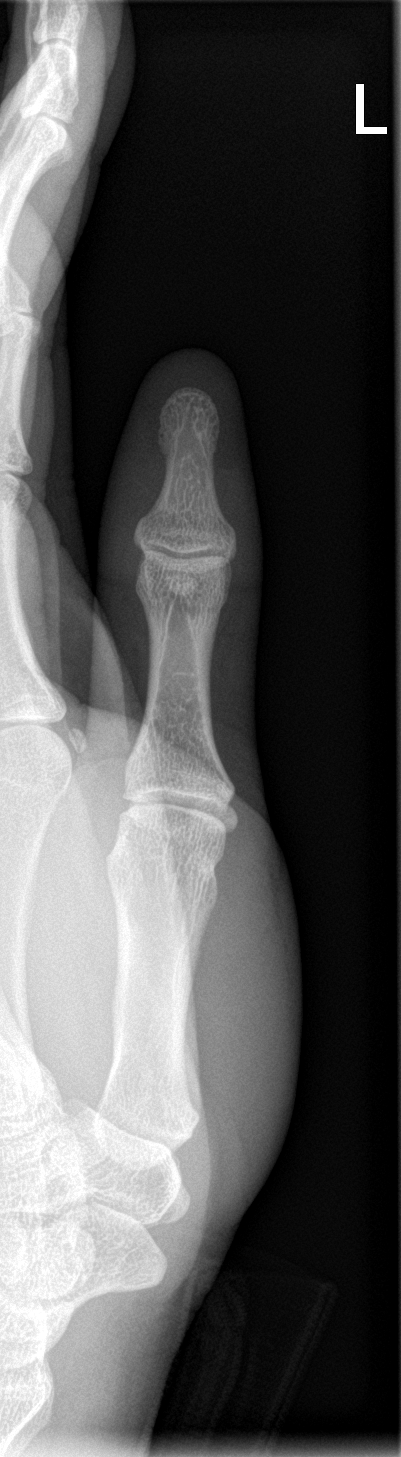

[finger obl]
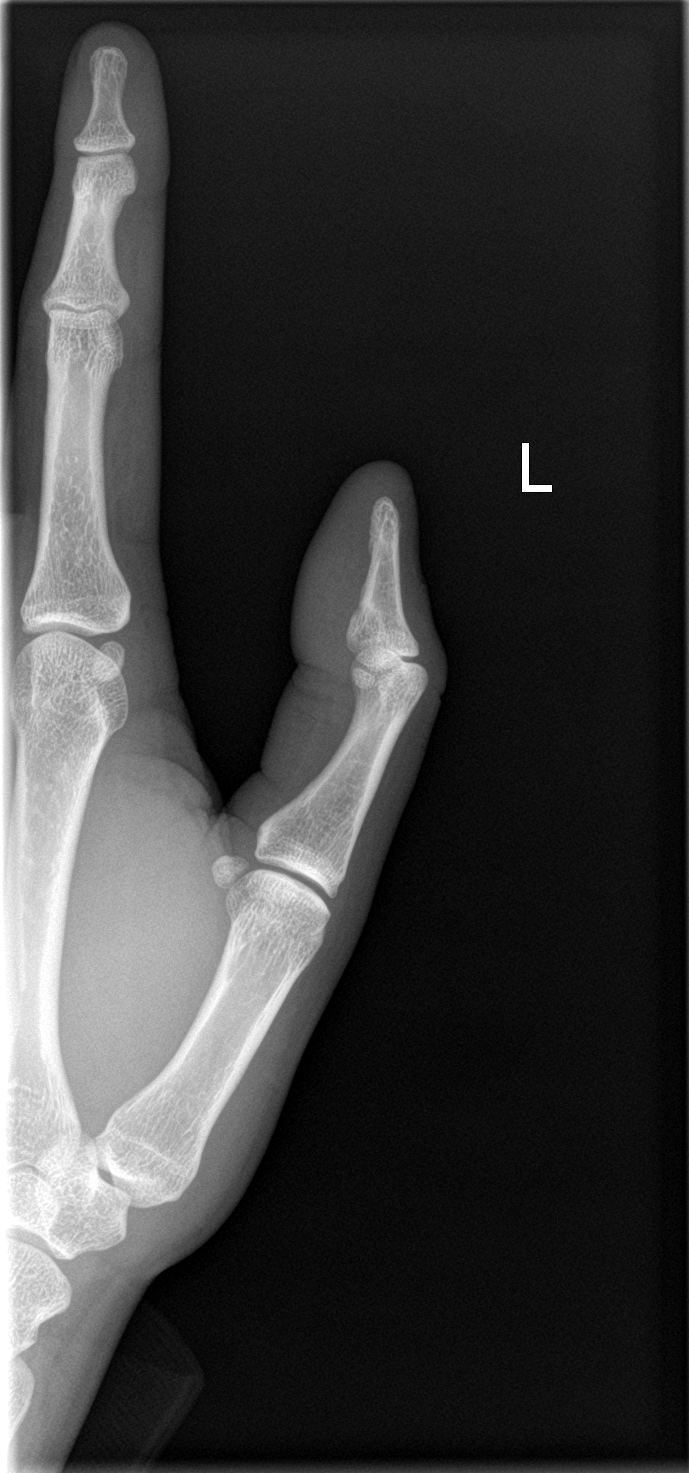

[finger lat]
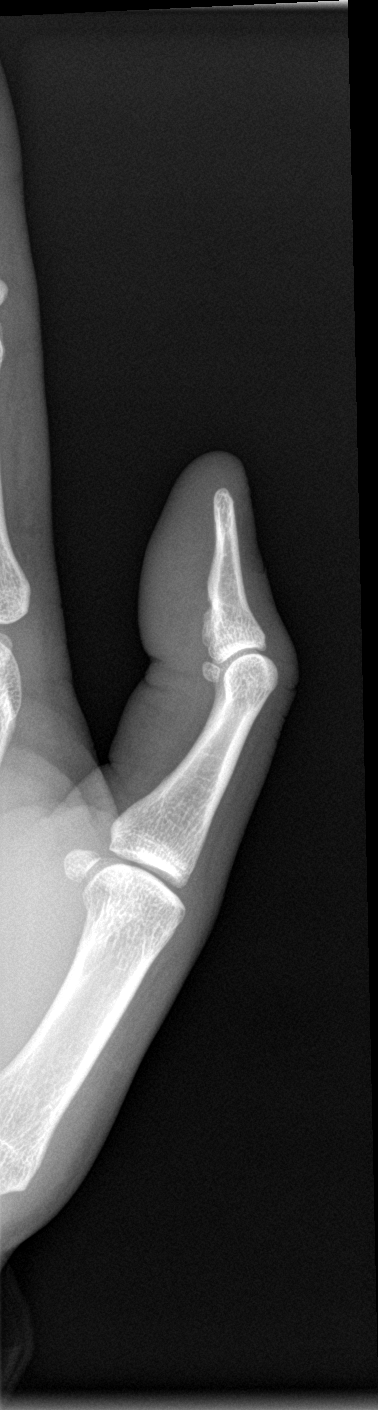

[3 of 3 positions shown; findings below may reference images not displayed]

FINDINGS: There is no evidence of fracture or dislocation. There is no
evidence of arthropathy or other focal bone abnormality. Soft
tissues are unremarkable.
IMPRESSION: Negative.

## 2024-09-19 ENCOUNTER — Encounter: Payer: Self-pay | Admitting: Physician Assistant

## 2024-09-19 ENCOUNTER — Ambulatory Visit: Admitting: Physician Assistant

## 2024-09-19 ENCOUNTER — Other Ambulatory Visit (HOSPITAL_COMMUNITY)
Admission: RE | Admit: 2024-09-19 | Discharge: 2024-09-19 | Disposition: A | Discharge status: Home / Self Care | Source: Ambulatory Visit | Attending: Physician Assistant | Admitting: Physician Assistant

## 2024-09-19 VITALS — BP 110/65 | HR 88 | Ht 67.0 in | Wt 140.8 lb

## 2024-09-19 DIAGNOSIS — Z01419 Encounter for gynecological examination (general) (routine) without abnormal findings: Secondary | ICD-10-CM

## 2024-09-19 DIAGNOSIS — Z124 Encounter for screening for malignant neoplasm of cervix: Secondary | ICD-10-CM | POA: Insufficient documentation

## 2024-09-19 DIAGNOSIS — L308 Other specified dermatitis: Secondary | ICD-10-CM | POA: Diagnosis not present

## 2024-09-19 DIAGNOSIS — Z3169 Encounter for other general counseling and advice on procreation: Secondary | ICD-10-CM

## 2024-09-19 LAB — CERVICOVAGINAL ANCILLARY ONLY
Chlamydia: NEGATIVE
Comment: NEGATIVE
Comment: NEGATIVE
Comment: NORMAL
Neisseria Gonorrhea: NEGATIVE
Trichomonas: NEGATIVE

## 2024-09-19 MED ORDER — TRIAMCINOLONE ACETONIDE 0.1 % EX OINT
TOPICAL_OINTMENT | CUTANEOUS | 0 refills | Status: AC
Start: 2024-09-19 — End: ?

## 2024-09-19 NOTE — Patient Instructions (Signed)
 What is eczema?    Eczema, also called ''atopic dermatitis,'' is a skin condition that makes the skin itchy and flaky. Doctors do not know what causes it. It often happens in people who have allergies. It can also run in families.    What are the symptoms of eczema?    The symptoms can include:    ?Intense itching    ?Color changes - In people with light skin, areas with eczema might look red or pink. In people with dark skin, they might appear dark brown, purple, or gray. Sometimes, there is a patch of skin that looks lighter than the skin around it.    ?Small bumps - These might look like dots or goosebumps (picture 1).    ?Skin that flakes off or forms scales (picture 2)    Most people with eczema have their first symptoms before they turn 5. But eczema can look different in people of different ages:    ?In babies and children younger than 59 years old, eczema tends to affect the front of the arms and legs, cheeks, or scalp (picture 3). The diaper area is not usually affected.    ?In older children and adults, eczema often affects the sides of the neck, the elbow creases, and the backs of the knees (picture 4). Adults can also get it on their wrists, hands, forearms, and face (picture 5).    ?In older children and adults, the skin can become thicker over time (picture 6), and can even form scars from too much scratching.    Is there a test for eczema?    No. But your doctor or nurse should be able to tell if you have eczema by looking at your skin and asking about your symptoms.    What can I do to reduce symptoms?    You can use unscented, thick moisturizing creams and ointments to keep your skin from getting too dry.    If possible, try to avoid or limit things that can make eczema worse. These include:    ?Being too hot or sweating too much    ?Being in very dry air    ?Stress or worry    ?Sudden temperature changes    ?Harsh soaps or cleaning products    ?Perfumes    ?Wool or synthetic fabrics (like polyester)    How is eczema treated?    There are treatments that can help relieve the symptoms, but eczema cannot be cured. Even so, about half of children with eczema grow out of it by the time they become adults.    Treatments for eczema include:    ?Moisturizing creams or ointments - These help keep the skin moist. In some cases, your doctor or nurse might suggest using a moist dressing over special creams or medicines. It helps to put on your cream or ointment right after a bath or shower.    Some people try products you put in the bathtub, such as oil or oatmeal. But these have been found to not help with eczema symptoms.    ?Steroid creams and ointments - These can help with itching and swelling. In severe cases, you might need steroids as pills. But your doctor or nurse will want you to stop taking steroid pills as soon as possible. Even though these medicines help, they can also cause problems of their own.    ?Other creams and ointments - Your doctor can prescribe these to help treat eczema on sensitive areas, such  as your face or groin, or if other creams and ointments do not work.    ?Antihistamine pills - These are medicines people often take for allergies. But they can sometimes relieve itching related to eczema.    Itching is sometimes worse at night, which can make it hard to sleep. If you have this problem, tell your doctor or nurse. They might recommend an antihistamine that can also help you sleep.    ?Phototherapy - Your skin is exposed to a special kind of light called ''ultraviolet'' light. This is usually done in a doctor's office. But this treatment is not used very much.    Doctors usually recommend phototherapy for people who do not get better with other treatments.    ?Medicines that change how the immune system works - These are only for people who do not get better with moisturizers and prescription steroid creams or ointments.    Can eczema be prevented?    Experts don't know. Babies who have a parent or sibling with eczema have a higher risk of getting it. For these babies, good skin care might be helpful, especially in cold or dry areas. Good skin care includes using moisturizing creams or ointments. But doctors don't yet know if this actually helps prevent eczema from happening later.    If you use cream or ointment on your newborn, wash your hands first. This helps lower the risk of getting germs on your baby's skin that could cause infection. Try to use products that come in a tube, instead of a jar you dip your fingers in.    When should I call the doctor?    Call for emergency help right away (in the [place]  and [place], call 9-1-1) if you have signs of a very bad allergic reaction called ''anaphylaxis.'' These include:    ?Hives, which are raised or puffy areas of skin that are itchy    ?Swelling, usually of the face, eyelids, ears, mouth, hands, or feet    ?Flushing or itching of the eyes or the skin on most of the body (without hives)    ?Runny nose or swelling of the tongue    ?Trouble breathing, noisy breathing (wheezing), coughing, or voice changes    ?Nausea, vomiting, or diarrhea    ?Feeling dizzy or passing out    ?Looking sick, seeming sleepier than usual, or not acting normally (in babies and young children)    Call for advice if:    ?You have signs of an infection - These include a fever of 100.4?F (38?C) or higher, or chills.    ?Your eczema is making you feel anxious or depressed - There are treatments that can help with this.    ?You have trouble sleeping because you are itching.    ?Your eczema:    Has pus or yellow scabs on it    Gets worse or covers most of your body    Is on your eyes or lips, or if you get a rash or blisters in your mouth    Gets worse after you were around someone with cold sores or fever blisters

## 2024-09-19 NOTE — Progress Notes (Signed)
 NGYN presents for AEX Pt states last PAP approx 2 years ago. No Hx of abnormal PAP Requesting STD testing, declines blood testing and BC

## 2024-09-19 NOTE — Progress Notes (Signed)
 ANNUAL EXAM Patient name: Katie Goodman MRN [id number]  Date of birth: Sep 02, 1996 Chief Complaint:   No chief complaint on file.  History of Present Illness:   Katie Goodman is a 28 y.o. G65P0010 female being seen today for a routine annual exam.   Current complaints: TTC for past two months without success.   Patient's last menstrual period was 09/15/2024.   The pregnancy intention screening data noted above was reviewed. Potential methods of contraception were discussed. The patient elected to proceed with No data recorded.   Last [facility] two years ago at the [facility]. Results were normal per patient.  H/O abnormal pap: no Last mammogram: never done due to age. Results were: N/A. Family h/o breast cancer: no Last colonoscopy: never done due to age. Results were: N/A. Family h/o colorectal cancer: no STI screening: Accepts Contraception: None      No data to display               No data to display           Review of Systems:   Pertinent items are noted in HPI Denies any headaches, blurred vision, fatigue, shortness of breath, chest pain, abdominal pain, abnormal vaginal discharge/itching/odor/irritation, problems with periods, bowel movements, urination, or intercourse unless otherwise stated above. Pertinent History Reviewed:  Reviewed past medical,surgical, social and family history.  Reviewed problem list, medications and allergies. Physical Assessment:   Vitals:   09/19/24 1003  BP: 110/65  Pulse: 88  Weight: 140 lb 12.8 oz (63.9 kg)  Height: 5' 7 (1.702 m)  Body mass index is 22.05 kg/m.        Physical Examination:   General appearance - well appearing, and in no distress  Mental status - alert, oriented to person, place, and time  Psych:  She has a normal mood and affect  Skin - +Patches of small papular lesions on left upper back, left lateral epicondyle, and right ankle. Appear dry with scale and excoriations.   Chest - effort normal, all lung fields  clear to auscultation bilaterally  Heart - normal rate and regular rhythm  Neck:  midline trachea, no thyromegaly or nodules  Breasts - breasts appear normal, no suspicious masses, no skin or nipple changes or  axillary nodes  Abdomen - soft, nontender, nondistended, no masses or organomegaly  Pelvic - VULVA: normal appearing vulva with no masses, tenderness or lesions  VAGINA: normal appearing vagina with normal color and discharge, no lesions  CERVIX: normal appearing cervix without discharge or lesions, no CMT  Thin prep pap is done without HR HPV cotesting  UTERUS: uterus is felt to be normal size, shape, consistency and nontender   ADNEXA: No adnexal masses or tenderness noted.  Extremities:  No swelling or varicosities noted  Chaperone present for exam  No results found for this or any previous visit (from the past 24 hours).  Assessment & Plan:  1. Encounter for annual routine gynecological examination (Primary) - Cervical cancer screening: Discussed screening Q3 years. Reviewed importance of annual exams and limits of pap smear. Pap with reflex HPV updated today.  - GC/CT: Discussed and recommended. Pt  accepts - Birth Control: None - Breast Health: Encouraged self breast awareness/exams. Teaching provided. - Mammogram: @ 26yo, or sooner if problems - Colonoscopy: @ 26yo, or sooner if problems - Follow-up: 12 months and prn  - Cytology - PAP( [place]) - Cervicovaginal ancillary only( [place])  2. Cervical cancer screening - Cytology -  PAP( [place])   3. Pre-conception counseling X2 months - Current birth control: None - She should take folic acid 400 mcg via PNV, etc.  - Medical problems: None, per patient. No history available in EMR. - Concerning social habits: Smoking  - Medications reviewed: None - Discussed tracking cycles in order to know when ovulating and use of apps and their limitations. Discussed use of ovulation kits - If not pregnant after 12  months of trying, she should come in for evaluation  4. Other eczema Papular - triamcinolone ointment (KENALOG) 0.1 %; Apply to affected areas of the body twice daily for two weeks.  Dispense: 30 g; Refill: 0   No orders of the defined types were placed in this encounter.  Meds:  Meds ordered this encounter  Medications   triamcinolone ointment (KENALOG) 0.1 %    Sig: Apply to affected areas of the body twice daily for two weeks.    Dispense:  30 g    Refill:  0   Follow-up: No follow-ups on file.  Chalee Hirota E Ijeoma Loor, [place] 09/19/2024 10:53 AM

## 2024-09-22 LAB — CYTOLOGY - PAP: Diagnosis: HIGH — AB

## 2024-09-26 ENCOUNTER — Encounter: Payer: Self-pay | Admitting: Physician Assistant

## 2024-09-26 ENCOUNTER — Ambulatory Visit (HOSPITAL_COMMUNITY): Payer: Self-pay | Admitting: Physician Assistant

## 2024-10-12 ENCOUNTER — Encounter: Payer: Self-pay | Admitting: Obstetrics & Gynecology

## 2024-10-12 ENCOUNTER — Ambulatory Visit: Admitting: Obstetrics & Gynecology

## 2024-10-12 ENCOUNTER — Other Ambulatory Visit (HOSPITAL_COMMUNITY)
Admission: RE | Admit: 2024-10-12 | Discharge: 2024-10-12 | Disposition: A | Source: Ambulatory Visit | Attending: Obstetrics & Gynecology | Admitting: Obstetrics & Gynecology

## 2024-10-12 VITALS — BP 107/69 | HR 88 | Ht 67.0 in | Wt 141.0 lb

## 2024-10-12 DIAGNOSIS — R87613 High grade squamous intraepithelial lesion on cytologic smear of cervix (HGSIL): Secondary | ICD-10-CM

## 2024-10-12 LAB — POCT URINE PREGNANCY: Preg Test, Ur: NEGATIVE

## 2024-10-12 NOTE — Progress Notes (Unsigned)
 Patient ID: Katie Goodman, female   DOB: 18-Sep-1996, 28 y.o.   MRN: [id number]  Chief Complaint  Patient presents with   Colposcopy    HPI Talani Z Falkner is a 28 y.o. female.  G1P0010 Patient's last menstrual period was 09/15/2024.  HPI  Indications: Pap smear on November 202025 showed: high-grade squamous intraepithelial neoplasia  (HGSIL-encompassing moderate and severe dysplasia). Previous colposcopy: . Prior cervical treatment: no treatment.  Past Medical History:  Diagnosis Date   UTI (urinary tract infection)     No past surgical history on file.  No family history on file.  Social History Social History[1]  Allergies[2]  Current Outpatient Medications  Medication Sig Dispense Refill   triamcinolone  ointment (KENALOG ) 0.1 % Apply to affected areas of the body twice daily for two weeks. 30 g 0   No current facility-administered medications for this visit.    Review of Systems Review of Systems  Genitourinary:  Negative for pelvic pain, vaginal bleeding and vaginal discharge.    Blood pressure 107/69, pulse 88, height 5' 7 (1.702 m), weight 141 lb (64 kg), last menstrual period 09/15/2024.  Physical Exam Physical Exam Vitals and nursing note reviewed. Exam conducted with a chaperone present.  Constitutional:      Appearance: Normal appearance.  Genitourinary:    General: Normal vulva.     Exam position: Lithotomy position.     Vagina: Normal.     Cervix: Normal.  Neurological:     Mental Status: She is alert.  Psychiatric:        Mood and Affect: Mood normal.        Behavior: Behavior normal.   Patient given informed consent, signed copy in the chart, time out was performed.  Placed in lithotomy position. Cervix viewed with speculum and colposcope after application of acetic acid.   Colposcopy adequate?  yes Acetowhite lesions?yes anterior and posterior Punctation?no Mosaicism?  no Abnormal vasculature?  no Biopsies? 12 and  6 ECC?yes  COMMENTS: Patient was given post procedure instructions.     Data Reviewed pap  Assessment     Complications: none.     Plan    Specimens labelled and sent to Pathology. If HSIL will need LEEP      Lynwood Solomons 10/12/2024, 4:35 PM     [1]  Social History Tobacco Use   Smoking status: Never   Smokeless tobacco: Never  Vaping Use   Vaping status: Former  Substance Use Topics   Alcohol use: Never   Drug use: Never  [2] No Known Allergies

## 2024-10-12 NOTE — Progress Notes (Signed)
 28 y.o. GYN presents for COLPO. DIAGNOSIS: - High grade squamous intraepithelial lesion (HSIL) Abnormal     UPT  Negative

## 2024-10-18 LAB — SURGICAL PATHOLOGY

## 2024-11-06 ENCOUNTER — Ambulatory Visit

## 2024-11-06 ENCOUNTER — Other Ambulatory Visit: Payer: Self-pay | Admitting: Obstetrics and Gynecology

## 2024-11-06 VITALS — BP 121/78 | HR 84 | Wt 134.3 lb

## 2024-11-06 DIAGNOSIS — Z3201 Encounter for pregnancy test, result positive: Secondary | ICD-10-CM | POA: Diagnosis not present

## 2024-11-06 DIAGNOSIS — Z3200 Encounter for pregnancy test, result unknown: Secondary | ICD-10-CM

## 2024-11-06 LAB — POCT URINE PREGNANCY: Preg Test, Ur: POSITIVE — AB

## 2024-11-06 MED ORDER — DOXYLAMINE-PYRIDOXINE 10-10 MG PO TBEC: 2.0000 | DELAYED_RELEASE_TABLET | Freq: Every day | ORAL | 2 refills | Status: AC

## 2024-11-06 NOTE — Progress Notes (Signed)
..  Katie Goodman presents today for UPT. She complains of nausea and vomiting. LMP: 09/15/24    OBJECTIVE: Appears well, in no apparent distress.  OB History     Gravida  2   Para      Term      Preterm      AB  1   Living         SAB      IAB  1   Ectopic      Multiple      Live Births             Home UPT Result:Positive In-Office UPT result:Positive I have reviewed the patient's medical, obstetrical, social, and family histories, and medications.   ASSESSMENT: Positive pregnancy test  PLAN Prenatal care to be completed at: [facility] Provided safe med list Sent Diclegis  for nausea to pharmacy  Pt is taking OTC PNVs

## 2024-11-12 ENCOUNTER — Inpatient Hospital Stay (HOSPITAL_COMMUNITY)
Admission: AD | Admit: 2024-11-12 | Discharge: 2024-11-12 | Disposition: A | Discharge status: Home / Self Care | Attending: Family Medicine | Admitting: Family Medicine

## 2024-11-12 ENCOUNTER — Other Ambulatory Visit: Payer: Self-pay

## 2024-11-12 ENCOUNTER — Inpatient Hospital Stay (HOSPITAL_COMMUNITY)

## 2024-11-12 DIAGNOSIS — O209 Hemorrhage in early pregnancy, unspecified: Secondary | ICD-10-CM | POA: Diagnosis not present

## 2024-11-12 DIAGNOSIS — R109 Unspecified abdominal pain: Secondary | ICD-10-CM | POA: Insufficient documentation

## 2024-11-12 DIAGNOSIS — Z3A08 8 weeks gestation of pregnancy: Secondary | ICD-10-CM

## 2024-11-12 DIAGNOSIS — O23591 Infection of other part of genital tract in pregnancy, first trimester: Secondary | ICD-10-CM | POA: Diagnosis not present

## 2024-11-12 DIAGNOSIS — B9689 Other specified bacterial agents as the cause of diseases classified elsewhere: Secondary | ICD-10-CM | POA: Diagnosis not present

## 2024-11-12 DIAGNOSIS — Z113 Encounter for screening for infections with a predominantly sexual mode of transmission: Secondary | ICD-10-CM | POA: Diagnosis present

## 2024-11-12 LAB — WET PREP, GENITAL
Trich, Wet Prep: NONE SEEN
WBC, Wet Prep HPF POC: >=10 — AB
Yeast Wet Prep HPF POC: NONE SEEN

## 2024-11-12 LAB — CBC
HCT: 34.1 % — ABNORMAL LOW (ref 36.0–46.0)
Hemoglobin: 12.3 g/dL (ref 12.0–15.0)
MCH: 33.4 pg (ref 26.0–34.0)
MCHC: 36.1 g/dL — ABNORMAL HIGH (ref 30.0–36.0)
MCV: 92.7 fL (ref 80.0–100.0)
Platelets: 295 K/uL (ref 150–400)
RBC: 3.68 MIL/uL — ABNORMAL LOW (ref 3.87–5.11)
RDW: 11.5 % (ref 11.5–15.5)
WBC: 8.1 K/uL (ref 4.0–10.5)
nRBC: 0 % (ref 0.0–0.2)

## 2024-11-12 LAB — URINALYSIS, ROUTINE W REFLEX MICROSCOPIC
Bilirubin Urine: NEGATIVE
Glucose, UA: NEGATIVE mg/dL
Hgb urine dipstick: NEGATIVE
Ketones, ur: NEGATIVE mg/dL
Leukocytes,Ua: NEGATIVE
Nitrite: NEGATIVE
Protein, ur: NEGATIVE mg/dL
Specific Gravity, Urine: 1.015 (ref 1.005–1.030)
pH: 8.5 — ABNORMAL HIGH (ref 5.0–8.0)

## 2024-11-12 LAB — HCG, QUANTITATIVE, PREGNANCY: hCG, Beta Chain, Quant, S: 171641 m[IU]/mL — ABNORMAL HIGH

## 2024-11-12 LAB — ABO/RH: ABO/RH(D): O POS

## 2024-11-12 MED ORDER — METRONIDAZOLE 500 MG PO TABS: 500.0000 mg | ORAL_TABLET | Freq: Two times a day (BID) | ORAL | 0 refills | Status: AC

## 2024-11-12 NOTE — Discharge Instructions (Signed)
 It was a pleasure taking care of you today.  I am sorry you are having this vaginal bleeding but it is likely due to the bacterial vaginosis and your intercourse.  I recommend pelvic rest and have sent a prescription for metronidazole  to your pharmacy which she can pick up.  You will take this medication twice daily for 7 days.  If your bleeding worsens or you have any other concerns please return for further evaluation.  I hope you have a great rest of your night!

## 2024-11-12 NOTE — MAU Provider Note (Signed)
 " History     CSN: [id number]  Arrival date and time: 11/12/24 1812   None     Chief Complaint  Patient presents with   Vaginal Bleeding   Abdominal Pain   HPI Patient presenting to the MAU for vaginal bleeding in early pregnancy.  Approximately 8 weeks per LMP.  Reports that bleeding started today.  She had intercourse this morning and the bleeding started after.  Reports lower abdominal cramping as well.  Has had normal morning sickness but no other symptoms.  No other concerns.  Receives care at [place] [facility].  First OB appointment scheduled for January 19.  OB History     Gravida  2   Para      Term      Preterm      AB  1   Living         SAB      IAB  1   Ectopic      Multiple      Live Births              Past Medical History:  Diagnosis Date   UTI (urinary tract infection)     No past surgical history on file.  No family history on file.  Social History[1]  Allergies: Allergies[2]  Medications Prior to Admission  Medication Sig Dispense Refill Last Dose/Taking   Prenatal MV & Min w/FA-DHA (PRENATAL GUMMIES PO) Take 3 tablets by mouth daily.   11/12/2024   Doxylamine -Pyridoxine  (DICLEGIS ) 10-10 MG TBEC Take 2 tablets by mouth at bedtime. 100 tablet 2    triamcinolone  ointment (KENALOG ) 0.1 % Apply to affected areas of the body twice daily for two weeks. (Patient not taking: Reported on 11/06/2024) 30 g 0     Review of Systems  Constitutional:  Negative for chills and fever.  HENT:  Negative for congestion and rhinorrhea.   Respiratory:  Positive for shortness of breath.   Cardiovascular:  Negative for chest pain.  Gastrointestinal:  Negative for abdominal pain.  Genitourinary:  Positive for vaginal bleeding and vaginal discharge.  Neurological:  Negative for headaches.   Physical Exam   Blood pressure 123/73, pulse 97, temperature 97.9 F (36.6 C), temperature source Oral, resp. rate 18, height 5' 7 (1.702 m), weight 62 kg, last menstrual  period 09/15/2024, SpO2 100%.  Physical Exam Vitals and nursing note reviewed.  Constitutional:      Appearance: Normal appearance.  HENT:     Head: Normocephalic and atraumatic.     Nose: No congestion or rhinorrhea.  Eyes:     Extraocular Movements: Extraocular movements intact.  Cardiovascular:     Rate and Rhythm: Normal rate.  Pulmonary:     Effort: Pulmonary effort is normal.  Abdominal:     Palpations: Abdomen is soft.     Tenderness: There is no abdominal tenderness.  Musculoskeletal:        General: Normal range of motion.     Cervical back: Normal range of motion.  Skin:    General: Skin is warm.     Capillary Refill: Capillary refill takes less than 2 seconds.  Neurological:     General: No focal deficit present.     Mental Status: She is alert.     Cranial Nerves: No cranial nerve deficit.  Psychiatric:        Mood and Affect: Mood normal.        Behavior: Behavior normal.     MAU Course  Procedures  MDM  CBC Ultrasound Wet prep   Assessment and Plan  Evalisse Z Luo is a 29 year old G2, P0 at 8 weeks 2 days presenting for vaginal bleeding early pregnancy  Vaginal bleeding in early pregnancy Patient with vaginal bleeding that started after intercourse today.  Is approximately [redacted] weeks pregnant.  CBC within normal limits, wet prep showing clue cells and WBCs consistent with bacterial vaginosis infection.  Ultrasound showing IUP measuring 8 weeks and 6 days by crown-rump length with a fetal heart rate of 168 bpm.  No subchorionic hemorrhage appreciated.  Discussed that this is likely bleeding from the cervix after intercourse with BV infection.  Discussed that this could be the start of a miscarriage but only time will tell.  Discussed tricked return precautions.  No further questions or concerns.  Patient discharged home.  Dehlia Kilner V Terrica Duecker 11/12/2024, 8:18 PM     [1]  Social History Tobacco Use   Smoking status: Never   Smokeless tobacco: Never  Vaping  Use   Vaping status: Former  Substance Use Topics   Alcohol use: Never   Drug use: Never  [2] No Known Allergies  "

## 2024-11-12 NOTE — MAU Note (Signed)
 RN called phlebotomy to inform of lab draw.

## 2024-11-12 NOTE — MAU Note (Signed)
 Katie Goodman is a 29 y.o. at [redacted]w[redacted]d here in MAU reporting: 40 minutes ago when she went to the bathroom and wiped she saw bright red bleeding. Lower abdominal cramping that started at the same time that comes and goes. Patient denies any unusual vaginal discharge, vaginal odor, itching or pain. Recent sexual intercourse this am. Reports nausea/ vomiting in the am (morning sickness) but no diarrhea. States it is not rectal bleeding-denies straining for BM or having hemorrhoids.  January 19th first OB appointment at [facility].     Onset of complaint: 1730 Pain score: 2 Vitals:   11/12/24 1830  BP: 123/73  Pulse: 97  Resp: 18  Temp: 97.9 F (36.6 C)  SpO2: 100%     FHT:n/a  Lab orders placed from triage:  CUBA workup

## 2024-11-13 LAB — GC/CHLAMYDIA PROBE AMP (~~LOC~~) NOT AT ARMC
Chlamydia: NEGATIVE
Comment: NEGATIVE
Comment: NORMAL
Neisseria Gonorrhea: NEGATIVE

## 2024-11-14 ENCOUNTER — Ambulatory Visit: Payer: Self-pay | Admitting: Advanced Practice Midwife

## 2024-11-15 ENCOUNTER — Ambulatory Visit: Payer: Self-pay | Admitting: Obstetrics & Gynecology

## 2024-11-15 NOTE — Progress Notes (Signed)
 Repeat pap 12 months

## 2024-11-20 ENCOUNTER — Ambulatory Visit: Payer: Self-pay | Admitting: *Deleted

## 2024-11-20 ENCOUNTER — Other Ambulatory Visit (INDEPENDENT_AMBULATORY_CARE_PROVIDER_SITE_OTHER): Payer: Self-pay

## 2024-11-20 VITALS — BP 115/78 | HR 92 | Wt 134.9 lb

## 2024-11-20 DIAGNOSIS — Z3481 Encounter for supervision of other normal pregnancy, first trimester: Secondary | ICD-10-CM

## 2024-11-20 DIAGNOSIS — Z362 Encounter for other antenatal screening follow-up: Secondary | ICD-10-CM

## 2024-11-20 DIAGNOSIS — Z3480 Encounter for supervision of other normal pregnancy, unspecified trimester: Secondary | ICD-10-CM | POA: Insufficient documentation

## 2024-11-20 DIAGNOSIS — Z3A09 9 weeks gestation of pregnancy: Secondary | ICD-10-CM

## 2024-11-20 NOTE — Progress Notes (Signed)
 New OB Intake  I connected with Katie Goodman  on 11/20/24 at  1:10 PM EST by In Person Visit and verified that I am speaking with the correct person using two identifiers. Nurse is located at [facility] and pt is located at [place].  I discussed the limitations, risks, security and privacy concerns of performing an evaluation and management service by telephone and the availability of in person appointments. I also discussed with the patient that there may be a patient responsible charge related to this service. The patient expressed understanding and agreed to proceed.  I explained I am completing New OB Intake today. We discussed EDD of 06/22/25 based on LMP of 09/15/24. Pt is G2P0010. I reviewed her allergies, medications and Medical/Surgical/OB history.    There are no active problems to display for this patient.    Concerns addressed today  Delivery Plans Plans to deliver at [facility]. Discussed the nature of our practice with multiple providers including residents and students as well as female and female providers. Due to the size of the practice, the delivering provider may not be the same as those providing prenatal care.   Patient is interested in water birth.  MyChart/Babyscripts MyChart access verified. I explained pt will have some visits in office and some virtually. Babyscripts instructions given and order placed. Patient verifies receipt of registration text/e-mail. Account successfully created and app downloaded. If patient is a candidate for Optimized scheduling, add to sticky note.   Blood Pressure Cuff/Weight Scale Patient has private insurance; instructed to purchase blood pressure cuff and bring to first prenatal appt. Explained after first prenatal appt pt will check weekly and document in Babyscripts. Patient does not have weight scale; patient may purchase if they desire to track weight weekly in Babyscripts.  Anatomy US  Explained first scheduled US  will be around 19  weeks. Anatomy US  scheduled for TBD at [facility].  Is patient a candidate for Babyscripts Optimization? Yes, patient accepted    First visit review I reviewed new OB appt with patient. Explained pt will be seen by [facility] at first visit. Discussed [name] genetic screening with patient. Requests Panorama and Horizon.. Routine prenatal labs OB Urine collected at today's visit. Initial prenatal labs deferred to New OB appt.   Last Pap Diagnosis  Date Value Ref Range Status  09/19/2024 (A)  Final   - High grade squamous intraepithelial lesion (HSIL)    Rocky Katie Ober, RN 11/20/2024  1:40 PM

## 2024-11-20 NOTE — Patient Instructions (Signed)
 [facility] [employer] [facility] has a partnership with the [employer] to provide prenatal navigation for the most needed resources in our community. In order to see how we can help connect you to these resources we need consent to contact you. Please complete the very short consent using the link below:   English Link: https://[email or web address].tfaforms.[email or web address]?[email or web address]  Spanish Link: https://[email or web address].tfaforms.[email or web address]?site=16  Options for Doula Care in the [place] Area  As you review your birthing options, consider having a birth doula. A doula is trained to provide support before, during and just after you give birth. There are also postpartum doulas that help you adjust to new parenthood.  While doulas do not provide medical care, they do provide emotional, physical and educational support. A few months before your baby arrives, doulas can help answer questions, ease concerns and help you create and support your birthing plan.    Doulas can help reduce your stress and comfort you and your partner. They can help you cope with labor by helping you use breathing techniques, massage, creative labor positioning, essential oils and affirmations.   Studies show that the benefits of having a doula include:   A more positive birth experience  Fewer requests for pain-relief medication  Less likelihood of cesarean section, commonly called a c-section   Doulas are typically hired via a advertising account planner between you and the doula. We are happy to provide a list of the most active doulas in the area, all of whom are credentialed by [facility] and will not count as a visitor at your birth.  There are several options for no-cost doula care at our hospital, including:  [facility] Volunteer Doula Program [employer] Program (in [place] only) A [employer] Doula Study through [employer]  For more information on these programs or to receive a list of doulas active in our area, please  email [email or web address]

## 2024-11-22 LAB — CULTURE, OB URINE

## 2024-11-22 LAB — URINE CULTURE, OB REFLEX

## 2024-12-12 ENCOUNTER — Encounter: Admitting: Obstetrics

## 2025-01-29 ENCOUNTER — Ambulatory Visit

## 2025-01-29 ENCOUNTER — Other Ambulatory Visit
